# Patient Record
Sex: Male | Born: 1960
Health system: Southern US, Community
[De-identification: ages and names within clinical notes are randomized; demographics above are authoritative.]

## PROBLEM LIST (undated history)

## (undated) DIAGNOSIS — M549 Dorsalgia, unspecified: Secondary | ICD-10-CM

## (undated) DIAGNOSIS — J40 Bronchitis, not specified as acute or chronic: Secondary | ICD-10-CM

## (undated) DIAGNOSIS — G8929 Other chronic pain: Secondary | ICD-10-CM

## (undated) HISTORY — PX: HIP SURGERY: SHX245

---

## 2011-01-12 ENCOUNTER — Emergency Department (HOSPITAL_COMMUNITY)
Admission: EM | Admit: 2011-01-12 | Discharge: 2011-01-12 | Disposition: A | Discharge status: Home / Self Care | Payer: Self-pay | Attending: Emergency Medicine | Admitting: Emergency Medicine

## 2011-01-12 ENCOUNTER — Emergency Department (HOSPITAL_COMMUNITY): Payer: Self-pay

## 2011-01-12 DIAGNOSIS — F17200 Nicotine dependence, unspecified, uncomplicated: Secondary | ICD-10-CM | POA: Insufficient documentation

## 2011-01-12 DIAGNOSIS — M545 Low back pain, unspecified: Secondary | ICD-10-CM | POA: Insufficient documentation

## 2011-01-12 DIAGNOSIS — Z96649 Presence of unspecified artificial hip joint: Secondary | ICD-10-CM | POA: Insufficient documentation

## 2011-03-08 ENCOUNTER — Emergency Department (HOSPITAL_COMMUNITY)
Admission: EM | Admit: 2011-03-08 | Discharge: 2011-03-08 | Disposition: A | Discharge status: Home / Self Care | Payer: Self-pay | Attending: Emergency Medicine | Admitting: Emergency Medicine

## 2011-03-08 ENCOUNTER — Encounter: Payer: Self-pay | Admitting: *Deleted

## 2011-03-08 DIAGNOSIS — F17200 Nicotine dependence, unspecified, uncomplicated: Secondary | ICD-10-CM | POA: Insufficient documentation

## 2011-03-08 DIAGNOSIS — M5416 Radiculopathy, lumbar region: Secondary | ICD-10-CM

## 2011-03-08 DIAGNOSIS — S335XXA Sprain of ligaments of lumbar spine, initial encounter: Secondary | ICD-10-CM | POA: Insufficient documentation

## 2011-03-08 DIAGNOSIS — X500XXA Overexertion from strenuous movement or load, initial encounter: Secondary | ICD-10-CM | POA: Insufficient documentation

## 2011-03-08 DIAGNOSIS — IMO0002 Reserved for concepts with insufficient information to code with codable children: Secondary | ICD-10-CM | POA: Insufficient documentation

## 2011-03-08 HISTORY — DX: Bronchitis, not specified as acute or chronic: J40

## 2011-03-08 MED ORDER — PREDNISONE 20 MG PO TABS
60.0000 mg | ORAL_TABLET | Freq: Once | ORAL | Status: AC
  Administered 2011-03-08: 60 mg via ORAL

## 2011-03-08 MED ORDER — CYCLOBENZAPRINE HCL 10 MG PO TABS: 5.0000 mg | ORAL_TABLET | Freq: Once | ORAL | Status: DC

## 2011-03-08 MED ORDER — OXYCODONE-ACETAMINOPHEN 5-325 MG PO TABS
1.0000 | ORAL_TABLET | Freq: Once | ORAL | Status: AC
  Administered 2011-03-08: 1 via ORAL

## 2011-03-08 MED ORDER — CYCLOBENZAPRINE HCL 10 MG PO TABS: 10.0000 mg | ORAL_TABLET | Freq: Three times a day (TID) | ORAL | Status: AC | PRN

## 2011-03-08 MED ORDER — PREDNISONE 20 MG PO TABS: 60.0000 mg | ORAL_TABLET | Freq: Every day | ORAL | Status: AC

## 2011-03-08 MED ORDER — CYCLOBENZAPRINE HCL 10 MG PO TABS
10.0000 mg | ORAL_TABLET | Freq: Once | ORAL | Status: AC
  Administered 2011-03-08: 10 mg via ORAL

## 2011-03-08 MED ORDER — OXYCODONE-ACETAMINOPHEN 5-325 MG PO TABS: 1.0000 | ORAL_TABLET | ORAL | Status: AC | PRN

## 2011-03-08 NOTE — ED Notes (Signed)
EDPA at bedside to examine 

## 2011-03-08 NOTE — ED Provider Notes (Signed)
History     Chief Complaint  Patient presents with  . Back Pain  . Leg Pain   Patient is a 50 y.o. male presenting with back pain and leg pain. The history is provided by the patient.  Back Pain  This is a recurrent (Has a history of occasional episodes of low back pain,  worsened yesterday after lifting air conditioners at work.) problem. The current episode started yesterday. The problem has not changed since onset.The pain is associated with lifting heavy objects. The pain is present in the lumbar spine. The quality of the pain is described as stabbing. The pain radiates to the left thigh and right thigh. The pain is at a severity of 10/10. Associated symptoms include leg pain. Pertinent negatives include no chest pain, no fever, no numbness, no headaches, no abdominal pain, no dysuria and no weakness.  Leg Pain  Pertinent negatives include no numbness.    Past Medical History  Diagnosis Date  . Bronchitis     Past Surgical History  Procedure Date  . Hip pain     Family History  Problem Relation Age of Onset  . Diabetes Mother   . Hyperlipidemia Mother   . Hypertension Mother     History  Substance Use Topics  . Smoking status: Current Everyday Smoker  . Smokeless tobacco: Not on file  . Alcohol Use: Yes      Review of Systems  Constitutional: Negative for fever.  HENT: Negative for congestion, sore throat and neck pain.   Eyes: Negative.   Respiratory: Negative for chest tightness and shortness of breath.   Cardiovascular: Negative for chest pain.  Gastrointestinal: Negative for nausea and abdominal pain.  Genitourinary: Negative.  Negative for dysuria, urgency and decreased urine volume.  Musculoskeletal: Positive for back pain. Negative for myalgias, joint swelling and arthralgias.  Skin: Negative.  Negative for rash and wound.  Neurological: Negative for dizziness, weakness, light-headedness, numbness and headaches.  Hematological: Negative.     Psychiatric/Behavioral: Negative.     Physical Exam  BP 137/99  Pulse 90  Temp 97.4 F (36.3 C)  Resp 20  Ht 5\' 6"  (1.676 m)  Wt 145 lb (65.772 kg)  BMI 23.40 kg/m2  SpO2 98%  Physical Exam  Constitutional: He is oriented to person, place, and time. He appears well-developed and well-nourished. He appears distressed.  HENT:  Head: Normocephalic.  Eyes: Conjunctivae are normal.  Neck: Normal range of motion. Neck supple.  Cardiovascular: Normal rate and regular rhythm.   Pulmonary/Chest: Effort normal and breath sounds normal.  Abdominal: Soft. He exhibits no distension. There is no tenderness.  Musculoskeletal:       Lumbar back: He exhibits decreased range of motion and spasm. He exhibits no swelling, no edema and no deformity.       Paralumbar ttp.  Neurological: He is alert and oriented to person, place, and time. He has normal strength and normal reflexes. No sensory deficit.  Reflex Scores:      Patellar reflexes are 2+ on the right side and 2+ on the left side.      Achilles reflexes are 2+ on the right side and 2+ on the left side.      Slight antalgic gait secondary to pain.  Skin: Skin is warm and dry. No abrasion and no rash noted.    ED Course  Procedures  MDM No weakness or numbness,  No urinary or bowel changes to suggest neurologic emergency.      Medical  screening examination/treatment/procedure(s) were performed by non-physician practitioner and as supervising physician I was immediately available for consultation/collaboration.   Candis Musa, PA 03/08/11 1153  Shelda Jakes, MD 03/24/11 [phone]

## 2011-03-08 NOTE — ED Notes (Signed)
Medicated as ordered for pain.

## 2011-03-08 NOTE — ED Notes (Signed)
Pt c/o lower back pain radiating down both legs x 1 month that got re aggravated yesterday moving air conditioners.

## 2011-03-08 NOTE — ED Notes (Signed)
Awaiting EDPA assessment. 

## 2011-03-08 NOTE — ED Notes (Signed)
C/o lower back pain for approx. 1 month-states felt pop after heavy lifting and was seen and tx for same in this ED at that time; reports worsening of pain yesterday after lifting an AC unit; states pain radiates down both legs.

## 2011-03-08 NOTE — ED Notes (Signed)
Left in c/o spouse for transport home; a&ox4; in no distress 

## 2011-07-14 ENCOUNTER — Encounter (HOSPITAL_COMMUNITY): Payer: Self-pay | Admitting: *Deleted

## 2011-07-14 ENCOUNTER — Emergency Department (HOSPITAL_COMMUNITY)
Admission: EM | Admit: 2011-07-14 | Discharge: 2011-07-14 | Disposition: A | Discharge status: Home / Self Care | Payer: Self-pay | Attending: Emergency Medicine | Admitting: Emergency Medicine

## 2011-07-14 DIAGNOSIS — M549 Dorsalgia, unspecified: Secondary | ICD-10-CM

## 2011-07-14 DIAGNOSIS — F17200 Nicotine dependence, unspecified, uncomplicated: Secondary | ICD-10-CM | POA: Insufficient documentation

## 2011-07-14 DIAGNOSIS — M545 Low back pain, unspecified: Secondary | ICD-10-CM | POA: Insufficient documentation

## 2011-07-14 DIAGNOSIS — X500XXA Overexertion from strenuous movement or load, initial encounter: Secondary | ICD-10-CM | POA: Insufficient documentation

## 2011-07-14 HISTORY — DX: Other chronic pain: G89.29

## 2011-07-14 HISTORY — DX: Dorsalgia, unspecified: M54.9

## 2011-07-14 MED ORDER — HYDROCODONE-ACETAMINOPHEN 5-500 MG PO TABS: 1.0000 | ORAL_TABLET | Freq: Four times a day (QID) | ORAL | Status: AC | PRN

## 2011-07-14 NOTE — ED Notes (Signed)
Pt presents with chronic low back pain the radiates to bilateral legs intermittently. Pt states he has been lifiting TVs and is now unable to handle the pain. Pt states he is supposed to be going to a bone specialist but does not have the finances.

## 2011-07-14 NOTE — ED Provider Notes (Signed)
History     CSN: [id number] Arrival date & time: 07/14/2011 11:51 AM   First MD Initiated Contact with Patient 07/14/11 1307      Chief Complaint  Patient presents with  . Back Pain    (Consider location/radiation/quality/duration/timing/severity/associated sxs/prior treatment) HPI Comments: Dylan Dalton is a 50 y.o. male who presents to the Emergency Department complaining of back pain for 2 weeks after lifting and carrying television up stairs. He has a history of back pain and is in the process of getting financial aid to see a specialist. He has not taken any medicines for the pain. He denies numbness, tingling or extremity weakness. He has no PCP.  Patient is a 50 y.o. male presenting with back pain.  Back Pain     Past Medical History  Diagnosis Date  . Bronchitis   . Chronic back pain     Past Surgical History  Procedure Date  . Hip surgery     Family History  Problem Relation Age of Onset  . Diabetes Mother   . Hyperlipidemia Mother   . Hypertension Mother     History  Substance Use Topics  . Smoking status: Current Everyday Smoker  . Smokeless tobacco: Not on file  . Alcohol Use: Yes      Review of Systems  Musculoskeletal: Positive for back pain.  10 Systems reviewed and are negative for acute change except as noted in the HPI.  Allergies  Aspirin and Codeine  Home Medications  No current outpatient prescriptions on file.  BP 164/101  Pulse 68  Temp(Src) 98.5 F (36.9 C) (Oral)  SpO2 99%  Physical Exam  Nursing note and vitals reviewed. Constitutional: He is oriented to person, place, and time. He appears well-developed and well-nourished. No distress.  HENT:  Head: Normocephalic.  Mouth/Throat: Oropharynx is clear and moist.       Essentially edentulous  Eyes: EOM are normal.  Neck: Normal range of motion.  Cardiovascular: Normal rate, normal heart sounds and intact distal pulses.   Pulmonary/Chest: Effort normal.  Abdominal: Soft.   Musculoskeletal:       No  Spinal pain to percussion. Tenderness to bilateral lumbar spinal muscles with palpations. Able to bend over to touch toes without pain. Lateral bending causes mild pain.  Neurological: He is alert and oriented to person, place, and time. He has normal reflexes.  Skin: Skin is warm and dry.    ED Course  Procedures (including critical care time)  Labs Reviewed - No data to display No results found.   No diagnosis found.    MDM  Patient with chronic back pain who has exacerbated his pain with heavy lifting. Will give him a new referral for orthopedics.Pt stable in ED with no significant deterioration in condition.The patient appears reasonably screened and/or stabilized for discharge and I doubt any other medical condition or other [facility] requiring further screening, evaluation, or treatment in the ED at this time prior to discharge.  MDM Reviewed: previous chart, nursing note and vitals Reviewed previous: x-ray        Nicoletta Dress. Colon Branch, MD 07/14/11 1335

## 2011-07-14 NOTE — ED Notes (Signed)
Pt c/o lower back pain x 6 months; pt states he was here and told to go to a bone specialist but they do not have the money and have come back here

## 2011-07-14 NOTE — ED Notes (Signed)
Pt a/ox4. Resp even and unlabored. NAD at this time. D/C instructions reviewed with pt. Pt verbalized understanding. Pt ambulated to lobby with steady gate.  

## 2011-11-07 ENCOUNTER — Emergency Department (HOSPITAL_COMMUNITY)
Admission: EM | Admit: 2011-11-07 | Discharge: 2011-11-07 | Disposition: A | Discharge status: Home / Self Care | Payer: Self-pay | Attending: Emergency Medicine | Admitting: Emergency Medicine

## 2011-11-07 ENCOUNTER — Encounter (HOSPITAL_COMMUNITY): Payer: Self-pay | Admitting: Emergency Medicine

## 2011-11-07 DIAGNOSIS — R05 Cough: Secondary | ICD-10-CM | POA: Insufficient documentation

## 2011-11-07 DIAGNOSIS — R059 Cough, unspecified: Secondary | ICD-10-CM | POA: Insufficient documentation

## 2011-11-07 DIAGNOSIS — IMO0002 Reserved for concepts with insufficient information to code with codable children: Secondary | ICD-10-CM | POA: Insufficient documentation

## 2011-11-07 DIAGNOSIS — G8929 Other chronic pain: Secondary | ICD-10-CM | POA: Insufficient documentation

## 2011-11-07 DIAGNOSIS — M549 Dorsalgia, unspecified: Secondary | ICD-10-CM | POA: Insufficient documentation

## 2011-11-07 DIAGNOSIS — S46912A Strain of unspecified muscle, fascia and tendon at shoulder and upper arm level, left arm, initial encounter: Secondary | ICD-10-CM

## 2011-11-07 DIAGNOSIS — X500XXA Overexertion from strenuous movement or load, initial encounter: Secondary | ICD-10-CM | POA: Insufficient documentation

## 2011-11-07 DIAGNOSIS — F17200 Nicotine dependence, unspecified, uncomplicated: Secondary | ICD-10-CM | POA: Insufficient documentation

## 2011-11-07 MED ORDER — HYDROCODONE-ACETAMINOPHEN 5-325 MG PO TABS
2.0000 | ORAL_TABLET | Freq: Once | ORAL | Status: AC
  Administered 2011-11-07: 2 via ORAL
  Filled 2011-11-07: qty 2

## 2011-11-07 MED ORDER — HYDROCODONE-ACETAMINOPHEN 5-325 MG PO TABS: 1.0000 | ORAL_TABLET | ORAL | Status: AC | PRN

## 2011-11-07 MED ORDER — PROMETHAZINE HCL 12.5 MG PO TABS
12.5000 mg | ORAL_TABLET | Freq: Once | ORAL | Status: AC
  Administered 2011-11-07: 12.5 mg via ORAL
  Filled 2011-11-07: qty 1

## 2011-11-07 NOTE — ED Notes (Signed)
Pt c/o left shoulder pain since moving furniture yesterday. Denies cp/sob.

## 2011-11-07 NOTE — ED Notes (Signed)
Pt presents with left shoulder pain secondary to an injury after moving furniture. Pt states has no strength in hand and arm after shoulder injury. Pulses present and strong.

## 2011-11-07 NOTE — Discharge Instructions (Signed)
Shoulder Sprain PLEASE USE THE SHOULDER SLING UNTIL SEEN BY ORTHOPEDICS. NORCO MAY CAUSE DROWSINESS, USE WITH CAUTION.     A shoulder sprain is the result of damage to the tough, fiber-like tissues (ligaments) that help hold your shoulder in place. The ligaments may be stretched or torn. Besides the main shoulder joint (the ball and socket), there are several smaller joints that connect the bones in this area. A sprain usually involves one of those joints. Most often it is the acromioclavicular (or AC) joint. That is the joint that connects the collarbone (clavicle) and the shoulder blade (scapula) at the top point of the shoulder blade (acromion). A shoulder sprain is a mild form of what is called a shoulder separation. Recovering from a shoulder sprain may take some time. For some, pain lingers for several months. Most people recover without long term problems. CAUSES   A shoulder sprain is usually caused by some kind of trauma. This might be:   Falling on an outstretched arm.   Being hit hard on the shoulder.   Twisting the arm.   Shoulder sprains are more likely to occur in people who:   Play sports.   Have balance or coordination problems.  SYMPTOMS   Pain when you move your shoulder.   Limited ability to move the shoulder.   Swelling and tenderness on top of the shoulder.   Redness or warmth in the shoulder.   Bruising.   A change in the shape of the shoulder.  DIAGNOSIS  Your healthcare provider may:  Ask about your symptoms.   Ask about recent activity that might have caused those symptoms.   Examine your shoulder. You may be asked to do simple exercises to test movement. The other shoulder will be examined for comparison.   Order some tests that provide a look inside the body. They can show the extent of the injury. The tests could include:   X-rays.   CT (computed tomography) scan.   MRI (magnetic resonance imaging) scan.  RISKS AND COMPLICATIONS  Loss of  full shoulder motion.   Ongoing shoulder pain.  TREATMENT  How long it takes to recover from a shoulder sprain depends on how severe it was. Treatment options may include:  Rest. You should not use the arm or shoulder until it heals.   Ice. For 2 or 3 days after the injury, put an ice pack on the shoulder up to 4 times a day. It should stay on for 15 to 20 minutes each time. Wrap the ice in a towel so it does not touch your skin.   Over-the-counter medicine to relieve pain.   A sling or brace. This will keep the arm still while the shoulder is healing.   Physical therapy or rehabilitation exercises. These will help you regain strength and motion. Ask your healthcare provider when it is OK to begin these exercises.   Surgery. The need for surgery is rare with a sprained shoulder, but some people may need surgery to keep the joint in place and reduce pain.  HOME CARE INSTRUCTIONS   Ask your healthcare provider about what you should and should not do while your shoulder heals.   Make sure you know how to apply ice to the correct area of your shoulder.   Talk with your healthcare provider about which medications should be used for pain and swelling.   If rehabilitation therapy will be needed, ask your healthcare provider to refer you to a therapist. If it  is not recommended, then ask about at-home exercises. Find out when exercise should begin.  SEEK MEDICAL CARE IF:  Your pain, swelling, or redness at the joint increases. SEEK IMMEDIATE MEDICAL CARE IF:   You have a fever.   You cannot move your arm or shoulder.  Document Released: 12/10/2008 Document Revised: 07/13/2011 Document Reviewed: 12/10/2008 [facility] Patient Information 2012 [place], [place].

## 2011-11-07 NOTE — ED Provider Notes (Signed)
History     CSN: [id number]  Arrival date & time 11/07/11  1436   First MD Initiated Contact with Patient 11/07/11 1608      Chief Complaint  Patient presents with  . Shoulder Pain    (Consider location/radiation/quality/duration/timing/severity/associated sxs/prior treatment) Patient is a 51 y.o. male presenting with shoulder pain. The history is provided by the patient.  Shoulder Pain This is a new problem. The current episode started yesterday. The problem occurs constantly. The problem has been gradually worsening. Associated symptoms include coughing. Pertinent negatives include no abdominal pain, arthralgias, chest pain or neck pain. Associated symptoms comments: Weakness and pain moving the left shoulder.. The symptoms are aggravated by nothing. He has tried nothing for the symptoms. The treatment provided no relief.    Past Medical History  Diagnosis Date  . Bronchitis   . Chronic back pain     Past Surgical History  Procedure Date  . Hip surgery     Family History  Problem Relation Age of Onset  . Diabetes Mother   . Hyperlipidemia Mother   . Hypertension Mother     History  Substance Use Topics  . Smoking status: Current Everyday Smoker  . Smokeless tobacco: Not on file  . Alcohol Use: Yes      Review of Systems  Constitutional: Negative for activity change.       All ROS Neg except as noted in HPI  HENT: Negative for nosebleeds and neck pain.   Eyes: Negative for photophobia and discharge.  Respiratory: Positive for cough. Negative for shortness of breath and wheezing.   Cardiovascular: Negative for chest pain and palpitations.  Gastrointestinal: Negative for abdominal pain and blood in stool.  Genitourinary: Negative for dysuria, frequency and hematuria.  Musculoskeletal: Positive for back pain. Negative for arthralgias.  Skin: Negative.   Neurological: Negative for dizziness, seizures and speech difficulty.  Psychiatric/Behavioral: Negative for  hallucinations and confusion.    Allergies  Aspirin and Codeine  Home Medications   Current Outpatient Rx  Name Route Sig Dispense Refill  . ALBUTEROL SULFATE HFA 108 (90 BASE) MCG/ACT IN AERS Inhalation Inhale 2 puffs into the lungs every 6 (six) hours as needed. Bronchitis    . HYDROCODONE-ACETAMINOPHEN 5-325 MG PO TABS Oral Take 1 tablet by mouth every 4 (four) hours as needed for pain. 20 tablet 0    BP 159/99  Pulse 94  Temp 98.8 F (37.1 C)  Resp 18  Ht 5\' 6"  (1.676 m)  Wt 145 lb (65.772 kg)  BMI 23.40 kg/m2  SpO2 98%  Physical Exam  Nursing note and vitals reviewed. Constitutional: He is oriented to person, place, and time. He appears well-developed and well-nourished.  Non-toxic appearance.  HENT:  Head: Normocephalic.  Right Ear: Tympanic membrane and external ear normal.  Left Ear: Tympanic membrane and external ear normal.  Eyes: EOM and lids are normal. Pupils are equal, round, and reactive to light.  Neck: Normal range of motion. Neck supple. Carotid bruit is not present.  Cardiovascular: Normal rate, regular rhythm, normal heart sounds, intact distal pulses and normal pulses.   Pulmonary/Chest: Breath sounds normal. No respiratory distress.  Abdominal: Soft. Bowel sounds are normal. There is no tenderness. There is no guarding.  Musculoskeletal: Normal range of motion.       Pt has pain with abduction and adduction of the left shoulder. He has difficulty raising the left shoulder. No dislocation.  Lymphadenopathy:       Head (right side): No submandibular  adenopathy present.       Head (left side): No submandibular adenopathy present.    He has no cervical adenopathy.  Neurological: He is alert and oriented to person, place, and time. He has normal strength. No cranial nerve deficit or sensory deficit.  Skin: Skin is warm and dry.  Psychiatric: He has a normal mood and affect. His speech is normal.    ED Course  Procedures (including critical care  time)  Labs Reviewed - No data to display No results found.   1. Left shoulder strain       MDM  I have reviewed nursing notes, vital signs, and all appropriate lab and imaging results for this patient. Pt injured the left shoulder lifting and moving furniture 4/1. Question strain vs partial muscle tear. Pt to use the sling until seen by orthopedics. Rx for norco given to the patient.       Kathie Dike, [place] 11/07/11 [phone]

## 2011-11-12 NOTE — ED Provider Notes (Signed)
Evaluation and management procedures were performed by the PA/NP/Resident Physician under my supervision/collaboration.   Jakyri Brunkhorst D Montee Tallman, MD 11/12/11 1603 

## 2012-02-06 ENCOUNTER — Emergency Department (HOSPITAL_COMMUNITY)
Admission: EM | Admit: 2012-02-06 | Discharge: 2012-02-06 | Disposition: A | Discharge status: Home / Self Care | Payer: Self-pay | Attending: Emergency Medicine | Admitting: Emergency Medicine

## 2012-02-06 ENCOUNTER — Encounter (HOSPITAL_COMMUNITY): Payer: Self-pay | Admitting: *Deleted

## 2012-02-06 ENCOUNTER — Emergency Department (HOSPITAL_COMMUNITY): Payer: Self-pay

## 2012-02-06 DIAGNOSIS — M25559 Pain in unspecified hip: Secondary | ICD-10-CM | POA: Insufficient documentation

## 2012-02-06 DIAGNOSIS — S7010XA Contusion of unspecified thigh, initial encounter: Secondary | ICD-10-CM | POA: Insufficient documentation

## 2012-02-06 DIAGNOSIS — S7000XA Contusion of unspecified hip, initial encounter: Secondary | ICD-10-CM | POA: Insufficient documentation

## 2012-02-06 DIAGNOSIS — W010XXA Fall on same level from slipping, tripping and stumbling without subsequent striking against object, initial encounter: Secondary | ICD-10-CM | POA: Insufficient documentation

## 2012-02-06 DIAGNOSIS — Z87828 Personal history of other (healed) physical injury and trauma: Secondary | ICD-10-CM | POA: Insufficient documentation

## 2012-02-06 DIAGNOSIS — F17200 Nicotine dependence, unspecified, uncomplicated: Secondary | ICD-10-CM | POA: Insufficient documentation

## 2012-02-06 MED ORDER — HYDROCODONE-ACETAMINOPHEN 5-325 MG PO TABS: 1.0000 | ORAL_TABLET | ORAL | Status: AC | PRN

## 2012-02-06 MED ORDER — HYDROCODONE-ACETAMINOPHEN 5-325 MG PO TABS: 1.0000 | ORAL_TABLET | Freq: Once | ORAL | Status: DC

## 2012-02-06 MED ORDER — HYDROCODONE-ACETAMINOPHEN 5-325 MG PO TABS
1.0000 | ORAL_TABLET | Freq: Once | ORAL | Status: AC
  Administered 2012-02-06: 1 via ORAL
  Filled 2012-02-06: qty 1
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
## 2012-02-06 NOTE — ED Notes (Signed)
Pt reports has crutches at home that he can use.
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
## 2012-02-06 NOTE — Discharge Instructions (Signed)
Your x-rays are normal today for any new injury.  Please use the medication prescribed for pain and use your crutches to help minimize weightbearing.  Warm compresses or heating pad 20 minutes 3-4 times daily might help soothe your injury.  Call Dr. Romeo Apple for recheck of your injury if not improving over the next 4-5 days.

## 2012-02-07 NOTE — ED Provider Notes (Signed)
Medical screening examination/treatment/procedure(s) were performed by non-physician practitioner and as supervising physician I was immediately available for consultation/collaboration.   Eren Ryser, MD 02/07/12 0706 

## 2012-02-26 ENCOUNTER — Encounter (HOSPITAL_COMMUNITY): Payer: Self-pay | Admitting: Emergency Medicine

## 2012-02-26 ENCOUNTER — Emergency Department (HOSPITAL_COMMUNITY)
Admission: EM | Admit: 2012-02-26 | Discharge: 2012-02-26 | Disposition: A | Discharge status: Home / Self Care | Payer: Self-pay | Attending: Emergency Medicine | Admitting: Emergency Medicine

## 2012-02-26 DIAGNOSIS — M25552 Pain in left hip: Secondary | ICD-10-CM

## 2012-02-26 DIAGNOSIS — M5430 Sciatica, unspecified side: Secondary | ICD-10-CM | POA: Insufficient documentation

## 2012-02-26 DIAGNOSIS — F17200 Nicotine dependence, unspecified, uncomplicated: Secondary | ICD-10-CM | POA: Insufficient documentation

## 2012-02-26 DIAGNOSIS — M5432 Sciatica, left side: Secondary | ICD-10-CM

## 2012-02-26 DIAGNOSIS — M25559 Pain in unspecified hip: Secondary | ICD-10-CM | POA: Insufficient documentation

## 2012-02-26 DIAGNOSIS — G8929 Other chronic pain: Secondary | ICD-10-CM | POA: Insufficient documentation

## 2012-02-26 MED ORDER — OXYCODONE-ACETAMINOPHEN 5-325 MG PO TABS
1.0000 | ORAL_TABLET | Freq: Once | ORAL | Status: AC
  Administered 2012-02-26: 1 via ORAL
  Filled 2012-02-26: qty 1

## 2012-02-26 MED ORDER — OXYCODONE-ACETAMINOPHEN 5-325 MG PO TABS: ORAL_TABLET | ORAL | Status: DC

## 2012-02-26 NOTE — ED Provider Notes (Addendum)
History     CSN: [id number]  Arrival date & time 02/26/12  9562   First MD Initiated Contact with Patient 02/26/12 [phone]      Chief Complaint  Patient presents with  . Hip Pain    (Consider location/radiation/quality/duration/timing/severity/associated sxs/prior treatment) HPI  Past Medical History  Diagnosis Date  . Bronchitis   . Chronic back pain     Past Surgical History  Procedure Date  . Hip surgery     Family History  Problem Relation Age of Onset  . Diabetes Mother   . Hyperlipidemia Mother   . Hypertension Mother     History  Substance Use Topics  . Smoking status: Current Everyday Smoker -- 0.5 packs/day  . Smokeless tobacco: Not on file  . Alcohol Use: Yes     occ.      Review of Systems  Allergies  Aspirin and Codeine  Home Medications   Current Outpatient Rx  Name Route Sig Dispense Refill  . ALBUTEROL SULFATE HFA 108 (90 BASE) MCG/ACT IN AERS Inhalation Inhale 2 puffs into the lungs every 6 (six) hours as needed. For shortness of breath    . IBUPROFEN 200 MG PO TABS Oral Take 400 mg by mouth every 6 (six) hours as needed. For pain    . OXYCODONE-ACETAMINOPHEN 5-325 MG PO TABS  One tab po q 4-6 hrs prn pain 20 tablet 0    BP 143/98  Pulse 70  Temp 98.2 F (36.8 C) (Oral)  Resp 18  Ht 5\' 6"  (1.676 m)  Wt 145 lb (65.772 kg)  BMI 23.40 kg/m2  SpO2 98%  Physical Exam  ED Course  Procedures (including critical care time)  Labs Reviewed - No data to display No results found.   1. Left hip pain   2. Left sided sciatica       MDM  Medical screening examination/treatment/procedure(s) were conducted as a shared visit with non-physician practitioner(s) and myself.  I personally evaluated the patient during the encounter..  Chronic left lateral hip pain. Recent x-ray negative. Discussed with orthopedic surgeon. Pain management.   Seen by me. Chronic left hip pain. Recent x-ray negative. Referral to orthopedics. Discussed with  orthopedic surgeon     Donnetta Hutching, MD 02/26/12 1228  Donnetta Hutching, MD 02/26/12 1229  Donnetta Hutching, MD 02/27/12 [phone]

## 2012-02-26 NOTE — ED Notes (Signed)
Pt states has titanum in left hip area. Fell beginning of month and has been hurting since. Pt seen 02-06-2012 for same. Pt states he knows something is wrong even though xrays don't show anything. Pt uncomfortable with sitting.

## 2012-02-26 NOTE — ED Provider Notes (Signed)
History     CSN: [id number]  Arrival date & time 02/26/12  4098   First MD Initiated Contact with Patient 02/26/12 [phone]      Chief Complaint  Patient presents with  . Hip Pain    (Consider location/radiation/quality/duration/timing/severity/associated sxs/prior treatment) HPI Comments: Was referred to dr. Romeo Apple after ED visit on 02-06-12.  Wife states she was told that if they paid $125 he would be given a note to see somebody else.  i spoke with dr. Romeo Apple today and he does know what that meant but that the pt did need to pay something and that he would see him.   Patient is a 51 y.o. male presenting with hip pain. The history is provided by the patient. No language interpreter was used.  Hip Pain This is a new problem. Episode onset: fell on 02-06-12. The problem occurs constantly. The problem has been unchanged. Pertinent negatives include no chills or fever. The symptoms are aggravated by walking and standing. He has tried nothing for the symptoms.    Past Medical History  Diagnosis Date  . Bronchitis   . Chronic back pain     Past Surgical History  Procedure Date  . Hip surgery     Family History  Problem Relation Age of Onset  . Diabetes Mother   . Hyperlipidemia Mother   . Hypertension Mother     History  Substance Use Topics  . Smoking status: Current Everyday Smoker -- 0.5 packs/day  . Smokeless tobacco: Not on file  . Alcohol Use: Yes     occ.      Review of Systems  Constitutional: Negative for fever and chills.  Musculoskeletal:       Hip pain   All other systems reviewed and are negative.    Allergies  Aspirin and Codeine  Home Medications   Current Outpatient Rx  Name Route Sig Dispense Refill  . ALBUTEROL SULFATE HFA 108 (90 BASE) MCG/ACT IN AERS Inhalation Inhale 2 puffs into the lungs every 6 (six) hours as needed. For shortness of breath    . IBUPROFEN 200 MG PO TABS Oral Take 400 mg by mouth every 6 (six) hours as needed. For  pain    . OXYCODONE-ACETAMINOPHEN 5-325 MG PO TABS  One tab po q 4-6 hrs prn pain 20 tablet 0    BP 143/98  Pulse 70  Temp 98.2 F (36.8 C) (Oral)  Resp 18  Ht 5\' 6"  (1.676 m)  Wt 145 lb (65.772 kg)  BMI 23.40 kg/m2  SpO2 98%  Physical Exam  Nursing note and vitals reviewed. Constitutional: He is oriented to person, place, and time. He appears well-developed and well-nourished.  HENT:  Head: Normocephalic and atraumatic.  Eyes: EOM are normal.  Neck: Normal range of motion.  Cardiovascular: Normal rate, regular rhythm, normal heart sounds and intact distal pulses.   Pulmonary/Chest: Effort normal and breath sounds normal. No respiratory distress.  Abdominal: Soft. He exhibits no distension. There is no tenderness.  Musculoskeletal: He exhibits tenderness.       Left hip: He exhibits decreased range of motion and bony tenderness. He exhibits no swelling, no crepitus, no deformity and no laceration.       Legs: Neurological: He is alert and oriented to person, place, and time.  Skin: Skin is warm and dry.  Psychiatric: He has a normal mood and affect. Judgment normal.    ED Course  Procedures (including critical care time)  Labs Reviewed - No data  to display No results found. Reviewed x-rays done on 02-06-12.  Another x-ray not ordered.  i have also spoken with dr. Romeo Apple.  Will see pt.  1. Left hip pain   2. Left sided sciatica       MDM  rx-percocet, 20         Evalina Field, [place] 02/26/12 1035  Evalina Field, [place] 02/27/12 [phone]

## 2012-03-01 NOTE — ED Provider Notes (Signed)
Medical screening examination/treatment/procedure(s) were performed by non-physician practitioner and as supervising physician I was immediately available for consultation/collaboration.  Left hip pain.  Recent x-ray negative for fracture. Will refer to orthopedics surgeon  Donnetta Hutching, MD 03/01/12 [phone]

## 2012-03-03 ENCOUNTER — Emergency Department (HOSPITAL_COMMUNITY)
Admission: EM | Admit: 2012-03-03 | Discharge: 2012-03-03 | Disposition: A | Discharge status: Home / Self Care | Payer: Self-pay | Attending: Emergency Medicine | Admitting: Emergency Medicine

## 2012-03-03 ENCOUNTER — Encounter (HOSPITAL_COMMUNITY): Payer: Self-pay | Admitting: *Deleted

## 2012-03-03 DIAGNOSIS — G8929 Other chronic pain: Secondary | ICD-10-CM | POA: Insufficient documentation

## 2012-03-03 DIAGNOSIS — M25559 Pain in unspecified hip: Secondary | ICD-10-CM | POA: Insufficient documentation

## 2012-03-03 DIAGNOSIS — M545 Low back pain, unspecified: Secondary | ICD-10-CM | POA: Insufficient documentation

## 2012-03-03 DIAGNOSIS — F17200 Nicotine dependence, unspecified, uncomplicated: Secondary | ICD-10-CM | POA: Insufficient documentation

## 2012-03-03 DIAGNOSIS — M5416 Radiculopathy, lumbar region: Secondary | ICD-10-CM

## 2012-03-03 DIAGNOSIS — R1032 Left lower quadrant pain: Secondary | ICD-10-CM

## 2012-03-03 MED ORDER — OXYCODONE-ACETAMINOPHEN 5-325 MG PO TABS: 1.0000 | ORAL_TABLET | ORAL | Status: AC | PRN

## 2012-03-03 MED ORDER — OXYCODONE-ACETAMINOPHEN 5-325 MG PO TABS
2.0000 | ORAL_TABLET | Freq: Once | ORAL | Status: AC
  Administered 2012-03-03: 2 via ORAL
  Filled 2012-03-03: qty 2

## 2012-03-03 MED ORDER — CYCLOBENZAPRINE HCL 10 MG PO TABS: 10.0000 mg | ORAL_TABLET | Freq: Three times a day (TID) | ORAL | Status: AC | PRN

## 2012-03-03 NOTE — ED Provider Notes (Signed)
History     CSN: [id number]  Arrival date & time 03/03/12  4098   First MD Initiated Contact with Patient 03/03/12 [phone]      Chief Complaint  Patient presents with  . Hip Pain    (Consider location/radiation/quality/duration/timing/severity/associated sxs/prior treatment) HPI Comments: Patient with hx of chronic low back pain and hip pain c/o increasing pain to the left hip for several days to a week.  Sx's began earlier this month after a fall.  States he has been seen here for similar sx's.  Pain is worse with weight bearing and improves with rest.He denies new injury or change in his previous symptoms.  States he has an upcoming appt with a "specialist".  He denies fever, urinary sx's, incontinence or numbness of his left hip or leg.    Patient is a 51 y.o. male presenting with back pain. The history is provided by the patient and the spouse.  Back Pain  This is a chronic problem. The problem occurs constantly. The problem has been gradually worsening. The pain is associated with falling. The pain is present in the lumbar spine and sacro-iliac joint. The quality of the pain is described as aching. The pain radiates to the left thigh, left knee and left foot. The pain is moderate. The symptoms are aggravated by certain positions, twisting and bending. The pain is the same all the time. Associated symptoms include leg pain and tingling. Pertinent negatives include no chest pain, no fever, no numbness, no abdominal pain, no abdominal swelling, no bowel incontinence, no perianal numbness, no bladder incontinence, no dysuria, no pelvic pain, no paresthesias, no paresis and no weakness. Associated symptoms comments: Left hip pain. He has tried NSAIDs, ice, heat and bed rest for the symptoms. The treatment provided mild relief.    Past Medical History  Diagnosis Date  . Bronchitis   . Chronic back pain     Past Surgical History  Procedure Date  . Hip surgery     Family History  Problem  Relation Age of Onset  . Diabetes Mother   . Hyperlipidemia Mother   . Hypertension Mother     History  Substance Use Topics  . Smoking status: Current Everyday Smoker -- 0.5 packs/day  . Smokeless tobacco: Not on file  . Alcohol Use: Yes     occ.      Review of Systems  Constitutional: Negative for fever.  Respiratory: Negative for shortness of breath.   Cardiovascular: Negative for chest pain.  Gastrointestinal: Negative for vomiting, abdominal pain, constipation and bowel incontinence.  Genitourinary: Negative for bladder incontinence, dysuria, hematuria, flank pain, decreased urine volume, difficulty urinating and pelvic pain.       Perineal numbness or incontinence of urine or feces  Musculoskeletal: Positive for back pain. Negative for joint swelling.  Skin: Negative for rash.  Neurological: Positive for tingling. Negative for weakness, numbness and paresthesias.  All other systems reviewed and are negative.    Allergies  Aspirin and Codeine  Home Medications   Current Outpatient Rx  Name Route Sig Dispense Refill  . ALBUTEROL SULFATE HFA 108 (90 BASE) MCG/ACT IN AERS Inhalation Inhale 2 puffs into the lungs every 6 (six) hours as needed. For shortness of breath    . IBUPROFEN 200 MG PO TABS Oral Take 400 mg by mouth every 6 (six) hours as needed. For pain    . OXYCODONE-ACETAMINOPHEN 5-325 MG PO TABS  One tab po q 4-6 hrs prn pain 20 tablet 0  BP 130/89  Pulse 68  Temp 98.1 F (36.7 C)  Resp 18  SpO2 97%  Physical Exam  Nursing note and vitals reviewed. Constitutional: He is oriented to person, place, and time. He appears well-developed and well-nourished. No distress.  HENT:  Head: Normocephalic and atraumatic.  Neck: Normal range of motion. Neck supple.  Cardiovascular: Normal rate, regular rhythm and intact distal pulses.   No murmur heard. Pulmonary/Chest: Effort normal and breath sounds normal.  Musculoskeletal: He exhibits tenderness. He  exhibits no edema.       Left hip: He exhibits tenderness. He exhibits normal range of motion, no bony tenderness, no swelling, no crepitus, no deformity and no laceration.       Lumbar back: He exhibits tenderness, bony tenderness and pain. He exhibits normal range of motion, no swelling, no deformity, no laceration and normal pulse.       Back:       Legs: Neurological: He is alert and oriented to person, place, and time. No cranial nerve deficit or sensory deficit. He exhibits normal muscle tone. Coordination and gait normal.  Reflex Scores:      Patellar reflexes are 2+ on the right side and 2+ on the left side.      Achilles reflexes are 2+ on the right side and 2+ on the left side. Skin: Skin is warm and dry.    ED Course  Procedures (including critical care time)  Labs Reviewed - No data to display      MDM    Patient has ttp of the lumbar spine and left paraspinal muscles.  No focal neuro deficits on exam.  Ambulates with a slow but steady gait.  Pt also has ttp of the left groin.  No edema, erythema, scrotal tenderness or masses.  Likely hip pain related to lumbar radiculopathy.  i doubt infectious process or cauda equina syndrome.   I will arrange for pt to have outpatient MRI of L spine and pelvis.  appt made for Wednesday 03/06/12 at 10:00 am.  Pt agrees to orthopedic f/u.  Patient requesting possible assistance.  Advised to contact Child psychotherapist for possible financial assistance.     The patient appears reasonably screened and/or stabilized for discharge and I doubt any other medical condition or other [facility] requiring further screening, evaluation, or treatment in the ED at this time prior to discharge.   Prescribed:  Flexeril percocet #24      Kenedy Haisley L. [place], [place] 03/08/12 1615

## 2012-03-03 NOTE — ED Notes (Signed)
Pt c/o fall on 02/06/2012, has been seen in er twice for same, continues to have left hip pain that radiates down left groin left leg area.

## 2012-03-03 NOTE — ED Notes (Signed)
Pt discharged. Pt stable at time of discharge. Medications reviewed pt has no questions regarding discharge at this time. Pt voiced understanding of discharge instructions. Pt informed of mri apt on Wednesday 03/06/12.

## 2012-03-05 MED FILL — Oxycodone w/ Acetaminophen Tab 5-325 MG: ORAL | Qty: 6 | Status: AC

## 2012-03-06 ENCOUNTER — Ambulatory Visit (HOSPITAL_COMMUNITY)
Admit: 2012-03-06 | Discharge: 2012-03-06 | Disposition: A | Discharge status: Home / Self Care | Payer: Self-pay | Attending: Emergency Medicine | Admitting: Emergency Medicine

## 2012-03-06 ENCOUNTER — Ambulatory Visit (HOSPITAL_COMMUNITY)
Admit: 2012-03-06 | Discharge: 2012-03-06 | Disposition: A | Discharge status: Home / Self Care | Payer: Self-pay | Source: Ambulatory Visit | Attending: Emergency Medicine | Admitting: Emergency Medicine

## 2012-03-06 ENCOUNTER — Ambulatory Visit (HOSPITAL_COMMUNITY)
Admission: RE | Admit: 2012-03-06 | Discharge: 2012-03-06 | Disposition: A | Discharge status: Home / Self Care | Payer: Self-pay | Source: Ambulatory Visit | Attending: Emergency Medicine | Admitting: Emergency Medicine

## 2012-03-06 ENCOUNTER — Other Ambulatory Visit (HOSPITAL_COMMUNITY): Payer: Self-pay | Admitting: Emergency Medicine

## 2012-03-06 DIAGNOSIS — M51379 Other intervertebral disc degeneration, lumbosacral region without mention of lumbar back pain or lower extremity pain: Secondary | ICD-10-CM | POA: Insufficient documentation

## 2012-03-06 DIAGNOSIS — M5126 Other intervertebral disc displacement, lumbar region: Secondary | ICD-10-CM | POA: Insufficient documentation

## 2012-03-06 DIAGNOSIS — M25559 Pain in unspecified hip: Secondary | ICD-10-CM | POA: Insufficient documentation

## 2012-03-06 DIAGNOSIS — Z01818 Encounter for other preprocedural examination: Secondary | ICD-10-CM

## 2012-03-06 DIAGNOSIS — M5137 Other intervertebral disc degeneration, lumbosacral region: Secondary | ICD-10-CM | POA: Insufficient documentation

## 2012-03-06 DIAGNOSIS — R1032 Left lower quadrant pain: Secondary | ICD-10-CM | POA: Insufficient documentation

## 2012-03-06 DIAGNOSIS — M545 Low back pain, unspecified: Secondary | ICD-10-CM | POA: Insufficient documentation

## 2012-03-13 NOTE — ED Provider Notes (Signed)
Medical screening examination/treatment/procedure(s) were conducted as a shared visit with non-physician practitioner(s) and myself.  I personally evaluated the patient during the encounter.  Left lower back pain with radiation to left foot. No bowel or bladder incontinence. Will get outpatient MRI of lumbar spine  Donnetta Hutching, MD 03/13/12 1512

## 2012-03-18 ENCOUNTER — Encounter: Payer: Self-pay | Admitting: Orthopedic Surgery

## 2012-03-18 ENCOUNTER — Ambulatory Visit (INDEPENDENT_AMBULATORY_CARE_PROVIDER_SITE_OTHER): Payer: Self-pay | Admitting: Orthopedic Surgery

## 2012-03-18 VITALS — BP 120/90 | Ht 66.0 in | Wt 145.0 lb

## 2012-03-18 DIAGNOSIS — M5136 Other intervertebral disc degeneration, lumbar region: Secondary | ICD-10-CM | POA: Insufficient documentation

## 2012-03-18 DIAGNOSIS — M169 Osteoarthritis of hip, unspecified: Secondary | ICD-10-CM | POA: Insufficient documentation

## 2012-03-18 DIAGNOSIS — M5126 Other intervertebral disc displacement, lumbar region: Secondary | ICD-10-CM

## 2012-03-18 DIAGNOSIS — M5137 Other intervertebral disc degeneration, lumbosacral region: Secondary | ICD-10-CM

## 2012-03-18 MED ORDER — HYDROCODONE-ACETAMINOPHEN 5-325 MG PO TABS: 1.0000 | ORAL_TABLET | ORAL | Status: AC | PRN

## 2012-03-18 NOTE — Patient Instructions (Addendum)
Referral to neurosurgeon for back and  Referral to joint specialist for hip at [facility] give at least 1 week notice for appointment   Total Hip Replacement In total hip replacement, the damaged hip is replaced with an artificial hip joint (prosthesis). The purpose of this surgery is to reduce pain and improve your range of motion. It is one of the most successful joint replacement surgeries. LET YOUR CAREGIVER KNOW ABOUT:    Allergies.   Medicines taken, including herbs, eyedrops, over-the-counter medicines, and creams.   Use of steroids (by mouth or creams).   Previous problems with anesthetics.   Family history of anesthetic problems.   Possibility of pregnancy, if this applies.   History of blood clots (thrombophlebitis).   History of bleeding or blood problems.   Previous surgery.   Other health problems.  BEFORE THE PROCEDURE    Do not eat or drink anything for as long as directed by your caregiver before surgery.   You should be present 60 minutes before your procedure or as directed by your caregiver.  PROCEDURE An intravenous (IV) line for giving fluids will be started. You will be given medicines and gas to make you sleep (anesthetic), or you will be given medicines through a needle in your back to make you numb from the waist down. Your surgeon will take out any damaged cartilage and bone. He or she will then put in new metal, plastic, or ceramic joint surfaces to restore alignment and function to your hip. AFTER THE PROCEDURE   After the procedure, you will be taken to the recovery area where a nurse will watch and check your progress. You may have a long, narrow tube (catheter) in your bladder after surgery. The catheter helps you empty your bladder (pass your urine). Once you are awake, stable, and taking fluids well, you will be returned to your room. You will receive physical therapy until you are doing well and your caregiver feels it is safe for you to go home.  If you do not have help at home, you may need to go to an extended care facility for 5 to 14 days after the procedure. Document Released: 10/30/2000 Document Revised: 07/13/2011 Document Reviewed: 05/26/2009 [facility] Patient Information 2012 [place], [place].Degenerative Disc Disease Degenerative disc disease is a condition caused by the changes that occur in the cushions of the backbone (spinal discs) as you grow older. Spinal discs are soft and compressible discs located between the bones of the spine (vertebrae). They act like shock absorbers. Degenerative disc disease can affect the wholespine. However, the neck and lower back are most commonly affected. Many changes can occur in the spinal discs with aging, such as:  The spinal discs may dry and shrink.   Small tears may occur in the tough, outer covering of the disc (annulus).   The disc space may become smaller due to loss of water.   Abnormal growths in the bone (spurs) may occur. This can put pressure on the nerve roots exiting the spinal canal, causing pain.   The spinal canal may become narrowed.  CAUSES   Degenerative disc disease is a condition caused by the changes that occur in the spinal discs with aging. The exact cause is not known, but there is a genetic basis for many patients. Degenerative changes can occur due to loss of fluid in the disc. This makes the disc thinner and reduces the space between the backbones. Small cracks can develop in the outer layer of  the disc. This can lead to the breakdown of the disc. You are more likely to get degenerative disc disease if you are overweight. Smoking cigarettes and doing heavy work such as weightlifting can also increase your risk of this condition. Degenerative changes can start after a sudden injury. Growth of bone spurs can compress the nerve roots and cause pain.   SYMPTOMS   The symptoms vary from person to person. Some people may have no pain, while others have severe pain. The  pain may be so severe that it can limit your activities. The location of the pain depends on the part of your backbone that is affected. You will have neck or arm pain if a disc in the neck area is affected. You will have pain in your back, buttocks, or legs if a disc in the lower back is affected. The pain becomes worse while bending, reaching up, or with twisting movements. The pain may start gradually and then get worse as time passes. It may also start after a major or minor injury. You may feel numbness or tingling in the arms or legs.   DIAGNOSIS   Your caregiver will ask you about your symptoms and about activities or habits that may cause the pain. He or she may also ask about any injuries, diseases, ortreatments you have had earlier. Your caregiver will examine you to check for the range of movement that is possible in the affected area, to check for strength in your extremities, and to check for sensation in the areas of the arms and legs supplied by different nerve roots. An X-ray of the spine may be taken. Your caregiver may suggest other imaging tests, such as a computerized magnetic scan (MRI), if needed.   TREATMENT   Treatment includes rest, modifying your activities, and applying ice and heat. Your caregiver may prescribe medicines to reduce your pain and may ask you to do some exercises to strengthen your back. In some cases, you may need surgery. You and your caregiver will decide on the treatment that is best for you. HOME CARE INSTRUCTIONS    Follow proper lifting and walking techniques as advised by your caregiver.   Maintain good posture.   Exercise regularly as advised.   Perform relaxation exercises.   Change your sitting, standing, and sleeping habits as advised. Change positions frequently.   Lose weight as advised.   Stop smoking if you smoke.   Wear supportive footwear.  SEEK MEDICAL CARE IF:   The pain does not go away within 1 to 4 weeks. SEEK IMMEDIATE MEDICAL  CARE IF:    The pain is severe.   You notice weakness in your arms, hands, or legs.   You begin to lose control of your bladder or bowel.  MAKE SURE YOU:    Understand these instructions.   Will watch your condition.   Will get help right away if you are not doing well or get worse.  Document Released: 05/21/2007 Document Revised: 07/13/2011 Document Reviewed: 05/21/2007 [facility] Patient Information 2012 [place], [place].Lumbar Discectomy Care After A discectomy involves removal of discmaterial (the cartilage-like structures located between the bones of the back). It is done to relieve pressure on nerve roots. It can be used as a treatment for a back problem. The time in surgery depends on the findings in surgery and what is necessary to correct the problems. HOME CARE INSTRUCTIONS    Check the cut (incision) made by the surgeon twice a day for signs of  infection. Some signs of infection may include:   A foul smelling, greenish or yellowish discharge from the wound.   Increased pain.   Increased redness over the incision (operative) site.   The skin edges may separate.   Flu-like symptoms (problems).   A temperature above 101.5 F (38.6 C).   Change your bandages in about 24 to 36 hours following surgery or as directed.   You may shower once the bandage is removed or as directed. Avoid bathtubs, swimming pools and hot tubs for three weeks or until your incision has healed completely. If you have stitches or staples, they may be removed 2 to 3 weeks after surgery, or as directed by your doctor. This may be done by your doctor or caregiver.   Follow your doctor's instructions as to safe activities, exercises, and physical therapy.   Weight reduction may be beneficial if you are overweight.   Daily exercise is helpful to prevent the return of problems. Walking is permitted. You may use a treadmill without an incline. Cut down on activities and exercise if you have discomfort.  You may also go up and down stairs as much as you can tolerate.   DO NOT lift anything heavier than 10 to 15 lbs. Avoid bending or twisting at the waist. Always bend your knees when lifting.   Maintain strength and range of motion as instructed.   Do not drive for 2 to 3 weeks, or as directed by your doctors. You may be a passenger for 20 to 30 minute trips. Lying back in the passenger seat may be more comfortable for you. Always wear a seatbelt.   Limit your sitting in a regular chair to 20 to 30 minutes at a time. There are no limitations for sitting in a recliner. You should lie down or walk in between sitting periods.   Only take over-the-counter or prescription medicines for pain, discomfort, or fever as directed by your caregiver.  SEEK MEDICAL CARE IF:    There is increased bleeding (more than a small spot) from the wound.   You notice redness, swelling, or increasing pain in the wound.   Pus is coming from wound.   You develop an unexplained oral temperature above 102 F (38.9 C) develops.   You notice a foul smell coming from the wound or dressing.   You have increasing pain in your wound.  SEEK IMMEDIATE MEDICAL CARE IF:    You develop a rash.   You have difficulty breathing.   You develop any allergic problems to medicines given.  Document Released: 06/28/2004 Document Revised: 07/13/2011 Document Reviewed: 10/17/2007 [facility] Patient Information 2012 [place], [place].
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
## 2012-03-19 ENCOUNTER — Other Ambulatory Visit: Payer: Self-pay | Admitting: *Deleted

## 2012-03-19 ENCOUNTER — Telehealth: Payer: Self-pay | Admitting: *Deleted

## 2012-03-19 DIAGNOSIS — IMO0002 Reserved for concepts with insufficient information to code with codable children: Secondary | ICD-10-CM

## 2012-03-19 DIAGNOSIS — M1612 Unilateral primary osteoarthritis, left hip: Secondary | ICD-10-CM

## 2012-03-19 NOTE — Telephone Encounter (Signed)
All notes sent to [facility] neurosurgery at fax # [phone] And [facility] orthopaedics at fax # [phone]

## 2012-03-21 ENCOUNTER — Telehealth: Payer: Self-pay | Admitting: *Deleted

## 2012-03-21 NOTE — Telephone Encounter (Signed)
Sorry that's all we can give

## 2012-03-21 NOTE — Telephone Encounter (Signed)
Called patient to advise, left message.  

## 2012-03-25 NOTE — Telephone Encounter (Signed)
Called patient, left message.

## 2012-05-02 ENCOUNTER — Telehealth: Payer: Self-pay | Admitting: Orthopedic Surgery

## 2012-05-02 NOTE — Telephone Encounter (Signed)
Patient is asking (called yesterday as well 05/01/12) to follow up on refill request from [facility] for Hydrocodone 5/325.  I followed up with pharmacy as nurse is not available this afternoon.  Per Kirt Boys at [facility], request was denied due to patient having been referred out (referral appointment scheduled 05/14/12 in [place].)  Routing to nurse to verify.  Patient's only current contact # is [phone] [facility]).

## 2012-05-03 NOTE — Telephone Encounter (Signed)
Patient and wife/companion (signed authorization on file to speak with her).  Made aware of the denial of the refill request per Dr. Romeo Apple, as noted.  Patient having difficulty finding transportation for the referral appointment in [facility].  Is there a specialist closer to [place] (possibly Dr. Channing Mutters, [facility])?  Wynelle Link CALL Ph # [phone], which is the only current working ph#.

## 2012-05-09 NOTE — Telephone Encounter (Signed)
Called patient, left message.

## 2012-05-17 NOTE — Telephone Encounter (Signed)
Called patient, not available, left message

## 2012-12-21 ENCOUNTER — Emergency Department (HOSPITAL_COMMUNITY)
Admission: EM | Admit: 2012-12-21 | Discharge: 2012-12-21 | Disposition: A | Discharge status: Home / Self Care | Payer: Self-pay | Attending: Emergency Medicine | Admitting: Emergency Medicine

## 2012-12-21 ENCOUNTER — Encounter (HOSPITAL_COMMUNITY): Payer: Self-pay

## 2012-12-21 ENCOUNTER — Emergency Department (HOSPITAL_COMMUNITY): Payer: Self-pay

## 2012-12-21 DIAGNOSIS — G8929 Other chronic pain: Secondary | ICD-10-CM | POA: Insufficient documentation

## 2012-12-21 DIAGNOSIS — Z9889 Other specified postprocedural states: Secondary | ICD-10-CM | POA: Insufficient documentation

## 2012-12-21 DIAGNOSIS — W11XXXA Fall on and from ladder, initial encounter: Secondary | ICD-10-CM | POA: Insufficient documentation

## 2012-12-21 DIAGNOSIS — S76012A Strain of muscle, fascia and tendon of left hip, initial encounter: Secondary | ICD-10-CM

## 2012-12-21 DIAGNOSIS — W19XXXA Unspecified fall, initial encounter: Secondary | ICD-10-CM

## 2012-12-21 DIAGNOSIS — F17200 Nicotine dependence, unspecified, uncomplicated: Secondary | ICD-10-CM | POA: Insufficient documentation

## 2012-12-21 DIAGNOSIS — Y9339 Activity, other involving climbing, rappelling and jumping off: Secondary | ICD-10-CM | POA: Insufficient documentation

## 2012-12-21 DIAGNOSIS — J45909 Unspecified asthma, uncomplicated: Secondary | ICD-10-CM | POA: Insufficient documentation

## 2012-12-21 DIAGNOSIS — IMO0002 Reserved for concepts with insufficient information to code with codable children: Secondary | ICD-10-CM | POA: Insufficient documentation

## 2012-12-21 DIAGNOSIS — S39012A Strain of muscle, fascia and tendon of lower back, initial encounter: Secondary | ICD-10-CM

## 2012-12-21 DIAGNOSIS — Y929 Unspecified place or not applicable: Secondary | ICD-10-CM | POA: Insufficient documentation

## 2012-12-21 DIAGNOSIS — S335XXA Sprain of ligaments of lumbar spine, initial encounter: Secondary | ICD-10-CM | POA: Insufficient documentation

## 2012-12-21 DIAGNOSIS — Z79899 Other long term (current) drug therapy: Secondary | ICD-10-CM | POA: Insufficient documentation

## 2012-12-21 MED ORDER — HYDROCODONE-ACETAMINOPHEN 5-325 MG PO TABS: 1.0000 | ORAL_TABLET | ORAL | Status: DC | PRN

## 2012-12-21 MED ORDER — HYDROCODONE-ACETAMINOPHEN 5-325 MG PO TABS
2.0000 | ORAL_TABLET | Freq: Once | ORAL | Status: AC
  Administered 2012-12-21: 2 via ORAL
  Filled 2012-12-21: qty 2

## 2012-12-21 NOTE — ED Notes (Signed)
Patient with no complaints at this time. Respirations even and unlabored. Skin warm/dry. Discharge instructions reviewed with patient at this time. Patient given opportunity to voice concerns/ask questions. Patient discharged at this time and left Emergency Department with steady gait.   

## 2012-12-21 NOTE — ED Provider Notes (Signed)
History  This chart was scribed for Joya Gaskins, MD by Shari Heritage, ED Scribe. The patient was seen in room APA12/APA12. Patient's care was started at 1014.   CSN: [id number]  Arrival date & time 12/21/12  1003   First MD Initiated Contact with Patient 12/21/12 1014      Chief Complaint  Patient presents with  . Fall    The history is provided by the patient. No language interpreter was used.    HPI Comments: Dylan Dalton is a 52 y.o. male with history of chronic back pain and asthma who presents to the Emergency Department complaining of a fall from a ladder than occurred yesterday. Patient states that he slipped from a height of 2 feet and landed on his back and left side. He denies head injury or loss of consciousness. He did not land on his feet. Patient states that he didn't have pain immediately after the fall, but he is now having moderate, constant lower back and left hip pain that is progressively worsening. He was ambulatory after the fall, but patient states that ambulation is difficult now secondary to pain. Patient denies neck pain, nausea, vomiting, extremity weakness or fever. Patient has a history of left hip surgery with hardware placement.    Past Medical History  Diagnosis Date  . Bronchitis   . Chronic back pain     Past Surgical History  Procedure Laterality Date  . Hip surgery      Family History  Problem Relation Age of Onset  . Diabetes Mother   . Hyperlipidemia Mother   . Hypertension Mother   . Arthritis    . Asthma      History  Substance Use Topics  . Smoking status: Current Every Day Smoker -- 0.50 packs/day  . Smokeless tobacco: Not on file  . Alcohol Use: Yes     Comment: occ.      Review of Systems  Constitutional: Negative for fever.  HENT: Negative for neck pain.   Gastrointestinal: Negative for nausea and vomiting.  Musculoskeletal: Positive for back pain.  Neurological: Negative for syncope and weakness.    Allergies   Aspirin; Codeine; and Oxycodone-acetaminophen  Home Medications   Current Outpatient Rx  Name  Route  Sig  Dispense  Refill  . albuterol (PROVENTIL HFA;VENTOLIN HFA) 108 (90 BASE) MCG/ACT inhaler   Inhalation   Inhale 2 puffs into the lungs every 6 (six) hours as needed. For shortness of breath           Triage Vitals: BP 120/85  Pulse 75  Temp(Src) 98.2 F (36.8 C) (Oral)  Resp 18  SpO2 97%  Physical Exam CONSTITUTIONAL: Well developed/well nourished HEAD: Normocephalic/atraumatic EYES: EOMI/PERRL ENMT: Mucous membranes moist NECK: supple no meningeal signs SPINE: no cervical tenderness, NEXUS criteria met, lower thoracic and lumbar tenderness noted, No bruising/crepitance/stepoffs noted to spine CV: S1/S2 noted, no murmurs/rubs/gallops noted LUNGS: Lungs are clear to auscultation bilaterally, no apparent distress ABDOMEN: soft, nontender, no rebound or guarding GU:no cva tenderness NEURO: Pt is awake/alert, moves all extremitiesx4 EXTREMITIES: pulses normal, full ROM, tenderness with ROM of left hip, All other extremities/joints palpated/ranged and nontender, no deformity noted SKIN: warm, color normal PSYCH: no abnormalities of mood noted  ED Course  Procedures  DIAGNOSTIC STUDIES: Oxygen Saturation is 97% on room air, adequate by my interpretation.    COORDINATION OF CARE: 10:32 AM- Patient informed of current plan for treatment and evaluation and agrees with plan at this time. Patient advised  to remain supine.   Dg Thoracic Spine 2 View  12/21/2012   *RADIOLOGY REPORT*  Clinical Data: Fall with mid back pain.  THORACIC SPINE - 2 VIEW  Comparison: None  Findings: Normal alignment is noted. There is no evidence of acute fracture or subluxation. Mild multilevel degenerative disc disease noted, greatest in the lower lumbar spine. No focal bony abnormalities are noted.  IMPRESSION: No evidence of acute bony abnormality.  Mild multilevel degenerative disc disease.    Original Report Authenticated By: Harmon Pier, M.D.   Dg Lumbar Spine Complete  12/21/2012   *RADIOLOGY REPORT*  Clinical Data: 52 year old male with low back pain following fall.  LUMBAR SPINE - COMPLETE 4+ VIEW  Comparison: 01/12/2011 lumbar spine  Findings: Five non-rib bearing lumbar type vertebra are again identified in normal alignment. There is no evidence of acute fracture or subluxation. Moderate to severe degenerative disc disease and facet arthropathy and L5-S1 again noted. No focal bony lesions or spondylolysis present.  IMPRESSION: No evidence of acute bony abnormality.  Moderate to severe degenerative changes at L5-S1.   Original Report Authenticated By: Harmon Pier, M.D.   Dg Hip Complete Left  12/21/2012   *RADIOLOGY REPORT*  Clinical Data: Fall with left hip pain.  History of previous left hip injury and surgery.  LEFT HIP - COMPLETE 2+ VIEW  Comparison: 11/24/2012  Findings: Internal fixation surgical hardware within the left acetabulum and left femur intertrochanteric region noted. There is no evidence of acute fracture or subluxation. No complicating hardware features are identified. No focal bony lesions are present. Degenerative changes within the left hip are noted.  IMPRESSION: No evidence of acute abnormality.  Left hip surgical hardware without definite complicating features.   Original Report Authenticated By: Harmon Pier, M.D.    Pt improved He ambulates without difficulty No other new complaints Short course of pain meds given He requests copies of xray results for his outpatient orthopedic care    MDM  Nursing notes including past medical history and social history reviewed and considered in documentation xrays reviewed and considered        I personally performed the services described in this documentation, which was scribed in my presence. The recorded information has been reviewed and is accurate.      Joya Gaskins, MD 12/21/12 [id number]

## 2012-12-21 NOTE — ED Notes (Signed)
Pt c/o back and hip pain, states that he missed a step climbing down a ladder yesterday causing him to fall, denies any LOC, Dr. Bebe Shaggy in prior to RN, see edp assessment for further.

## 2012-12-21 NOTE — ED Notes (Signed)
Pt fell yesterday approx 2 feet from ladder, denies loc, "hurting all over", mainly lower back, left hip.

## 2013-03-16 ENCOUNTER — Emergency Department (HOSPITAL_COMMUNITY)
Admission: EM | Admit: 2013-03-16 | Discharge: 2013-03-16 | Disposition: A | Discharge status: Home / Self Care | Payer: Self-pay | Attending: Emergency Medicine | Admitting: Emergency Medicine

## 2013-03-16 ENCOUNTER — Emergency Department (HOSPITAL_COMMUNITY): Payer: Self-pay

## 2013-03-16 ENCOUNTER — Encounter (HOSPITAL_COMMUNITY): Payer: Self-pay | Admitting: Emergency Medicine

## 2013-03-16 DIAGNOSIS — R52 Pain, unspecified: Secondary | ICD-10-CM | POA: Insufficient documentation

## 2013-03-16 DIAGNOSIS — M25469 Effusion, unspecified knee: Secondary | ICD-10-CM | POA: Insufficient documentation

## 2013-03-16 DIAGNOSIS — R1011 Right upper quadrant pain: Secondary | ICD-10-CM

## 2013-03-16 DIAGNOSIS — K828 Other specified diseases of gallbladder: Secondary | ICD-10-CM | POA: Insufficient documentation

## 2013-03-16 DIAGNOSIS — Z8709 Personal history of other diseases of the respiratory system: Secondary | ICD-10-CM | POA: Insufficient documentation

## 2013-03-16 DIAGNOSIS — R7989 Other specified abnormal findings of blood chemistry: Secondary | ICD-10-CM

## 2013-03-16 DIAGNOSIS — D135 Benign neoplasm of extrahepatic bile ducts: Secondary | ICD-10-CM

## 2013-03-16 DIAGNOSIS — M2550 Pain in unspecified joint: Secondary | ICD-10-CM | POA: Insufficient documentation

## 2013-03-16 DIAGNOSIS — M549 Dorsalgia, unspecified: Secondary | ICD-10-CM | POA: Insufficient documentation

## 2013-03-16 DIAGNOSIS — R945 Abnormal results of liver function studies: Secondary | ICD-10-CM | POA: Insufficient documentation

## 2013-03-16 DIAGNOSIS — F17200 Nicotine dependence, unspecified, uncomplicated: Secondary | ICD-10-CM | POA: Insufficient documentation

## 2013-03-16 DIAGNOSIS — G8929 Other chronic pain: Secondary | ICD-10-CM | POA: Insufficient documentation

## 2013-03-16 DIAGNOSIS — F1010 Alcohol abuse, uncomplicated: Secondary | ICD-10-CM

## 2013-03-16 DIAGNOSIS — Z720 Tobacco use: Secondary | ICD-10-CM

## 2013-03-16 DIAGNOSIS — D7589 Other specified diseases of blood and blood-forming organs: Secondary | ICD-10-CM

## 2013-03-16 LAB — COMPREHENSIVE METABOLIC PANEL
ALT: 21 U/L (ref 0–53)
AST: 47 U/L — ABNORMAL HIGH (ref 0–37)
Albumin: 3.8 g/dL (ref 3.5–5.2)
Alkaline Phosphatase: 150 U/L — ABNORMAL HIGH (ref 39–117)
Chloride: 101 mEq/L (ref 96–112)
Potassium: 4.5 mEq/L (ref 3.5–5.1)
Sodium: 141 mEq/L (ref 135–145)
Total Bilirubin: 0.5 mg/dL (ref 0.3–1.2)

## 2013-03-16 LAB — CBC WITH DIFFERENTIAL/PLATELET
Basophils Absolute: 0.1 10*3/uL (ref 0.0–0.1)
Basophils Relative: 1 % (ref 0–1)
Hemoglobin: 14.9 g/dL (ref 13.0–17.0)
MCHC: 35.1 g/dL (ref 30.0–36.0)
Monocytes Relative: 7 % (ref 3–12)
Neutro Abs: 3.3 10*3/uL (ref 1.7–7.7)
Neutrophils Relative %: 58 % (ref 43–77)
Platelets: 308 10*3/uL (ref 150–400)
RDW: 13.9 % (ref 11.5–15.5)

## 2013-03-16 LAB — URINALYSIS, ROUTINE W REFLEX MICROSCOPIC
Bilirubin Urine: NEGATIVE
Glucose, UA: NEGATIVE mg/dL
Ketones, ur: NEGATIVE mg/dL
Specific Gravity, Urine: >1.03 — ABNORMAL HIGH (ref 1.005–1.030)
pH: 5.5 (ref 5.0–8.0)

## 2013-03-16 MED ORDER — ONDANSETRON HCL 4 MG/2ML IJ SOLN
4.0000 mg | Freq: Once | INTRAMUSCULAR | Status: AC
  Administered 2013-03-16: 4 mg via INTRAVENOUS
  Filled 2013-03-16: qty 2

## 2013-03-16 MED ORDER — SODIUM CHLORIDE 0.9 % IV SOLN
INTRAVENOUS | Status: DC
  Administered 2013-03-16: 12:00:00 via INTRAVENOUS

## 2013-03-16 MED ORDER — ONDANSETRON HCL 4 MG PO TABS: 4.0000 mg | ORAL_TABLET | Freq: Three times a day (TID) | ORAL | Status: DC | PRN

## 2013-03-16 MED ORDER — IOHEXOL 300 MG/ML  SOLN
50.0000 mL | Freq: Once | INTRAMUSCULAR | Status: AC | PRN
  Administered 2013-03-16: 50 mL via ORAL

## 2013-03-16 MED ORDER — MORPHINE SULFATE 4 MG/ML IJ SOLN
4.0000 mg | Freq: Once | INTRAMUSCULAR | Status: AC
  Administered 2013-03-16: 4 mg via INTRAVENOUS
  Filled 2013-03-16: qty 1

## 2013-03-16 MED ORDER — IOHEXOL 300 MG/ML  SOLN
100.0000 mL | Freq: Once | INTRAMUSCULAR | Status: AC | PRN
  Administered 2013-03-16: 100 mL via INTRAVENOUS

## 2013-03-16 MED ORDER — OXYCODONE-ACETAMINOPHEN 5-325 MG PO TABS: ORAL_TABLET | ORAL | Status: DC

## 2013-03-16 MED ORDER — SODIUM CHLORIDE 0.9 % IV BOLUS (SEPSIS)
500.0000 mL | Freq: Once | INTRAVENOUS | Status: AC
  Administered 2013-03-16: 11:00:00 via INTRAVENOUS

## 2013-03-16 NOTE — ED Provider Notes (Addendum)
CSN: [id number]     Arrival date & time 03/16/13  4098 History  This chart was scribed for Ward Givens, MD by Greggory Stallion, ED Scribe. This patient was seen in room APA19/APA19 and the patient's care was started at 10:07 AM.   Chief Complaint  Patient presents with  . Generalized Body Aches  . Abdominal Pain   The history is provided by the patient. No language interpreter was used.    HPI Comments: Dylan Dalton is a 52 y.o. male who presents to the Emergency Department complaining of gradual onset, constant RUQ abdominal pain that started 3 days ago after eating a tomato sandwich. He states sitting still worsens the pain. Pt states nothing makes it better. His wife states he has had a mild fever subjectively. He has bruising and swelling in his bilateral knees and flanks that also started 3 days ago. He states he normally does not bruise easily. On Wednesday, pt states he had a very sharp pain in his RLQ. Pt states he is having different back pain from his normal chronic pain. Pt denies nausea, emesis, diarrhea, hematochezia and hematuria as associated symptoms. He he used his inhaler Friday morning because he wasn't breathing well. Pt smokes about 0.5 packs of cigarettes a day and drinks about 1/5 of liquor per week. He had about 4 shots last night. Pt has never been told he has liver problems such as cirrhosis. States he had a liver bx done at age 51 yo.   He does not have a PCP.  Past Medical History  Diagnosis Date  . Bronchitis   . Chronic back pain    Past Surgical History  Procedure Laterality Date  . Hip surgery     Family History  Problem Relation Age of Onset  . Diabetes Mother   . Hyperlipidemia Mother   . Hypertension Mother   . Arthritis    . Asthma     History  Substance Use Topics  . Smoking status: Current Every Day Smoker -- 0.50 packs/day    Types: Cigarettes  . Smokeless tobacco: Not on file  . Alcohol Use: Yes     Comment: occ.  lives at home  Lives  with spouse Employed in [employer]  Review of Systems  Gastrointestinal: Positive for abdominal pain. Negative for nausea, vomiting and blood in stool.  Genitourinary: Negative for hematuria.  Musculoskeletal: Positive for back pain, joint swelling (bilateral knees) and arthralgias.  All other systems reviewed and are negative.    Allergies  Aspirin; Codeine; and Oxycodone-acetaminophen  Home Medications   Current Outpatient Rx  Name  Route  Sig  Dispense  Refill  . albuterol (PROVENTIL HFA;VENTOLIN HFA) 108 (90 BASE) MCG/ACT inhaler   Inhalation   Inhale 2 puffs into the lungs every 6 (six) hours as needed. For shortness of breath         . HYDROcodone-acetaminophen (NORCO/VICODIN) 5-325 MG per tablet   Oral   Take 1 tablet by mouth every 4 (four) hours as needed for pain.   15 tablet   0    BP 125/86  Pulse 70  Temp(Src) 97.8 F (36.6 C) (Oral)  Resp 16  Wt 150 lb (68.04 kg)  BMI 24.22 kg/m2  SpO2 98%  Vital signs normal    Physical Exam  Nursing note and vitals reviewed. Constitutional: He is oriented to person, place, and time. He appears well-developed and well-nourished.  Non-toxic appearance. He does not appear ill. No distress.  HENT:  Head:  Normocephalic and atraumatic.  Right Ear: External ear normal.  Left Ear: External ear normal.  Nose: Nose normal. No mucosal edema or rhinorrhea.  Mouth/Throat: Oropharynx is clear and moist and mucous membranes are normal. No dental abscesses or edematous.  Eyes: Conjunctivae and EOM are normal. Pupils are equal, round, and reactive to light.  Neck: Normal range of motion and full passive range of motion without pain. Neck supple.  Cardiovascular: Normal rate, regular rhythm and normal heart sounds.  Exam reveals no gallop and no friction rub.   No murmur heard. Pulmonary/Chest: Effort normal and breath sounds normal. No respiratory distress. He has no wheezes. He has no rhonchi. He has no rales. He exhibits no  tenderness and no crepitus.  Abdominal: Soft. Normal appearance and bowel sounds are normal. He exhibits no distension. There is no tenderness. There is no rebound and no guarding.  Liver size is about 15 cm.   Musculoskeletal: Normal range of motion. He exhibits no edema.  Moves all extremities well. No effusion to bilateral knees.   Neurological: He is alert and oriented to person, place, and time. He has normal strength. No cranial nerve deficit.  Skin: Skin is warm, dry and intact. No rash noted. No erythema. No pallor.  Scattered yellow bruising on bilateral posterior ribcage.   Psychiatric: He has a normal mood and affect. His speech is normal and behavior is normal. His mood appears not anxious.    ED Course   Procedures (including critical care time)  Medications  0.9 %  sodium chloride infusion ( Intravenous New Bag/Given 03/16/13 1152)  morphine 4 MG/ML injection 4 mg (4 mg Intravenous Given 03/16/13 1053)  ondansetron (ZOFRAN) injection 4 mg (4 mg Intravenous Given 03/16/13 1053)  sodium chloride 0.9 % bolus 500 mL (0 mLs Intravenous Stopped 03/16/13 1152)  iohexol (OMNIPAQUE) 300 MG/ML solution 50 mL (50 mLs Oral Contrast Given 03/16/13 1155)  iohexol (OMNIPAQUE) 300 MG/ML solution 100 mL (100 mLs Intravenous Contrast Given 03/16/13 1156)  morphine 4 MG/ML injection 4 mg (4 mg Intravenous Given 03/16/13 1455)    DIAGNOSTIC STUDIES: Oxygen Saturation is 98% on RA, normal by my interpretation.    COORDINATION OF CARE: 10:37 AM-Discussed treatment plan which includes IV fluids, CT scan and UA with pt at bedside and pt agreed to plan.   Results for orders placed during the hospital encounter of 03/16/13  CBC WITH DIFFERENTIAL      Result Value Range   WBC 5.8  4.0 - 10.5 K/uL   RBC 3.97 (*) 4.22 - 5.81 MIL/uL   Hemoglobin 14.9  13.0 - 17.0 g/dL   HCT 16.1  09.6 - 04.5 %   MCV 106.8 (*) 78.0 - 100.0 fL   MCH 37.5 (*) 26.0 - 34.0 pg   MCHC 35.1  30.0 - 36.0 g/dL   RDW 40.9   81.1 - 91.4 %   Platelets 308  150 - 400 K/uL   Neutrophils Relative % 58  43 - 77 %   Neutro Abs 3.3  1.7 - 7.7 K/uL   Lymphocytes Relative 31  12 - 46 %   Lymphs Abs 1.8  0.7 - 4.0 K/uL   Monocytes Relative 7  3 - 12 %   Monocytes Absolute 0.4  0.1 - 1.0 K/uL   Eosinophils Relative 3  0 - 5 %   Eosinophils Absolute 0.2  0.0 - 0.7 K/uL   Basophils Relative 1  0 - 1 %   Basophils Absolute 0.1  0.0 - 0.1 K/uL  COMPREHENSIVE METABOLIC PANEL      Result Value Range   Sodium 141  135 - 145 mEq/L   Potassium 4.5  3.5 - 5.1 mEq/L   Chloride 101  96 - 112 mEq/L   CO2 30  19 - 32 mEq/L   Glucose, Bld 91  70 - 99 mg/dL   BUN 4 (*) 6 - 23 mg/dL   Creatinine, Ser 1.61  0.50 - 1.35 mg/dL   Calcium 9.9  8.4 - 09.6 mg/dL   Total Protein 7.7  6.0 - 8.3 g/dL   Albumin 3.8  3.5 - 5.2 g/dL   AST 47 (*) 0 - 37 U/L   ALT 21  0 - 53 U/L   Alkaline Phosphatase 150 (*) 39 - 117 U/L   Total Bilirubin 0.5  0.3 - 1.2 mg/dL   GFR calc non Af Amer >90  >90 mL/min   GFR calc Af Amer >90  >90 mL/min  LIPASE, BLOOD      Result Value Range   Lipase 25  11 - 59 U/L  URINALYSIS, ROUTINE W REFLEX MICROSCOPIC      Result Value Range   Color, Urine YELLOW  YELLOW   APPearance CLEAR  CLEAR   Specific Gravity, Urine >1.030 (*) 1.005 - 1.030   pH 5.5  5.0 - 8.0   Glucose, UA NEGATIVE  NEGATIVE mg/dL   Hgb urine dipstick NEGATIVE  NEGATIVE   Bilirubin Urine NEGATIVE  NEGATIVE   Ketones, ur NEGATIVE  NEGATIVE mg/dL   Protein, ur NEGATIVE  NEGATIVE mg/dL   Urobilinogen, UA 0.2  0.0 - 1.0 mg/dL   Nitrite NEGATIVE  NEGATIVE   Leukocytes, UA NEGATIVE  NEGATIVE  ETHANOL      Result Value Range   Alcohol, Ethyl (B) 119 (*) 0 - 11 mg/dL   Laboratory interpretation all normal except minor elevation of lft's, alcohol intoxication, elevated MCV c/w folic acid/Vit B12 deficiency from alcoholism   Ct Abdomen Pelvis W Contrast  03/16/2013   *RADIOLOGY REPORT*  Clinical Data: Right-sided abdominal pain.  Hepatomegaly  on exam.  CT ABDOMEN AND PELVIS WITH CONTRAST  Technique:  Multidetector CT imaging of the abdomen and pelvis was performed following the standard protocol during bolus administration of intravenous contrast.  Contrast: 50mL OMNIPAQUE IOHEXOL 300 MG/ML  SOLN, OMNIPAQUE IOHEXOL 300 MG/ML  SOLN  Comparison: None.  Findings: Subpleural scarring or subsegmental atelectasis posteriorly in the visualized lung bases.  Unremarkable liver, spleen, adrenal glands, kidneys, pancreas.  There is marked attenuation of the lumen in the gallbladder fundus with marked wall thickening up to 12 mm, mucosal enhancement, and poorly defined peripheral margin of the gallbladder wall.  There is a very sharp transition to a basically normal appearing gallbladder in the more central aspect down to the level of the cystic duct. There is no intra or extrahepatic biliary ductal dilatation.  Patchy aortoiliac atheromatous plaque without aneurysm or high- grade stenosis.  Portal vein patent.  No adenopathy localized.  The stomach is physiologically distended.  Small bowel and colon nondilated.  Normal appendix.  Scattered diverticula in the ascending, descending, and proximal sigmoid portions of the colon without adjacent inflammatory/edematous change.  Streak artifact from fixation hardware in the left acetabulum and femur.  No ascites.  No free air.  Urinary bladder incompletely distended. Advanced degenerative disc disease L5-S1.  IMPRESSION:  1.  Abnormal gallbladder fundus suggesting localized adenomyomatosis.  Indistinct peripheral margins suggest possible superimposed inflammatory change.  Ultrasound may be useful to exclude   stone and demonstrate   characteristic comet-tail artifact of adenomyomatosis. 2.  Colonic diverticulosis.   Original Report Authenticated By: D. Deanne Coffer III, MD      1. RUQ abdominal pain   2. Adenomyoma of gallbladder   3. Alcohol abuse   4. Tobacco abuse   5. Elevated liver function tests   6.  Macrocytosis without anemia     New Prescriptions   ONDANSETRON (ZOFRAN) 4 MG TABLET    Take 1 tablet (4 mg total) by mouth every 8 (eight) hours as needed for nausea.   OXYCODONE-ACETAMINOPHEN (PERCOCET/ROXICET) 5-325 MG PER TABLET    Take 1 or 2 po Q 6hrs for pain    Plan discharge   Devoria Albe, MD, FACEP    MDM     I personally performed the services described in this documentation, which was scribed in my presence. The recorded information has been reviewed and considered.  Devoria Albe, MD, FACEP   Ward Givens, MD 03/16/13 1542  Ward Givens, MD 03/16/13 [phone]

## 2013-03-16 NOTE — ED Notes (Signed)
States he is having generalized body aches and right mid abdominal pain.  Reports easy bruising and swelling in his bilateral knees.

## 2013-03-17 ENCOUNTER — Ambulatory Visit (HOSPITAL_COMMUNITY)
Admit: 2013-03-17 | Discharge: 2013-03-17 | Disposition: A | Discharge status: Home / Self Care | Payer: Self-pay | Attending: Emergency Medicine | Admitting: Emergency Medicine

## 2013-03-17 DIAGNOSIS — R1011 Right upper quadrant pain: Secondary | ICD-10-CM | POA: Insufficient documentation

## 2013-03-19 ENCOUNTER — Encounter (HOSPITAL_COMMUNITY): Payer: Self-pay

## 2013-03-19 ENCOUNTER — Emergency Department (HOSPITAL_COMMUNITY): Payer: Self-pay

## 2013-03-19 ENCOUNTER — Emergency Department (HOSPITAL_COMMUNITY)
Admission: EM | Admit: 2013-03-19 | Discharge: 2013-03-19 | Disposition: A | Discharge status: Home / Self Care | Payer: Self-pay | Attending: Emergency Medicine | Admitting: Emergency Medicine

## 2013-03-19 DIAGNOSIS — R109 Unspecified abdominal pain: Secondary | ICD-10-CM

## 2013-03-19 DIAGNOSIS — Z8709 Personal history of other diseases of the respiratory system: Secondary | ICD-10-CM | POA: Insufficient documentation

## 2013-03-19 DIAGNOSIS — Z79899 Other long term (current) drug therapy: Secondary | ICD-10-CM | POA: Insufficient documentation

## 2013-03-19 DIAGNOSIS — G8929 Other chronic pain: Secondary | ICD-10-CM | POA: Insufficient documentation

## 2013-03-19 DIAGNOSIS — R112 Nausea with vomiting, unspecified: Secondary | ICD-10-CM | POA: Insufficient documentation

## 2013-03-19 DIAGNOSIS — R1031 Right lower quadrant pain: Secondary | ICD-10-CM | POA: Insufficient documentation

## 2013-03-19 DIAGNOSIS — F17200 Nicotine dependence, unspecified, uncomplicated: Secondary | ICD-10-CM | POA: Insufficient documentation

## 2013-03-19 LAB — CBC WITH DIFFERENTIAL/PLATELET
Eosinophils Absolute: 0.2 10*3/uL (ref 0.0–0.7)
Eosinophils Relative: 3 % (ref 0–5)
Hemoglobin: 13.7 g/dL (ref 13.0–17.0)
Lymphocytes Relative: 22 % (ref 12–46)
Lymphs Abs: 1.3 10*3/uL (ref 0.7–4.0)
MCH: 37.1 pg — ABNORMAL HIGH (ref 26.0–34.0)
MCV: 108.4 fL — ABNORMAL HIGH (ref 78.0–100.0)
Monocytes Relative: 9 % (ref 3–12)
Neutrophils Relative %: 65 % (ref 43–77)
RBC: 3.69 MIL/uL — ABNORMAL LOW (ref 4.22–5.81)
WBC: 5.9 10*3/uL (ref 4.0–10.5)

## 2013-03-19 LAB — COMPREHENSIVE METABOLIC PANEL
ALT: 15 U/L (ref 0–53)
Alkaline Phosphatase: 127 U/L — ABNORMAL HIGH (ref 39–117)
BUN: 7 mg/dL (ref 6–23)
CO2: 28 mEq/L (ref 19–32)
GFR calc Af Amer: >90 mL/min (ref >90)
GFR calc non Af Amer: >90 mL/min (ref >90)
Glucose, Bld: 90 mg/dL (ref 70–99)
Potassium: 4.4 mEq/L (ref 3.5–5.1)
Sodium: 140 mEq/L (ref 135–145)
Total Bilirubin: 0.8 mg/dL (ref 0.3–1.2)
Total Protein: 6.8 g/dL (ref 6.0–8.3)

## 2013-03-19 LAB — LIPASE, BLOOD: Lipase: 23 U/L (ref 11–59)

## 2013-03-19 MED ORDER — DICYCLOMINE HCL 20 MG PO TABS: 20.0000 mg | ORAL_TABLET | Freq: Four times a day (QID) | ORAL | Status: DC

## 2013-03-19 MED ORDER — IOHEXOL 300 MG/ML  SOLN
50.0000 mL | Freq: Once | INTRAMUSCULAR | Status: AC | PRN
  Administered 2013-03-19: 50 mL via ORAL

## 2013-03-19 MED ORDER — PROMETHAZINE HCL 25 MG PO TABS: 25.0000 mg | ORAL_TABLET | Freq: Four times a day (QID) | ORAL | Status: DC | PRN

## 2013-03-19 MED ORDER — DICYCLOMINE HCL 10 MG PO CAPS
20.0000 mg | ORAL_CAPSULE | Freq: Once | ORAL | Status: AC
  Administered 2013-03-19: 20 mg via ORAL
  Filled 2013-03-19 (×2): qty 1

## 2013-03-19 MED ORDER — OXYCODONE-ACETAMINOPHEN 5-325 MG PO TABS
1.0000 | ORAL_TABLET | Freq: Once | ORAL | Status: AC
  Administered 2013-03-19: 1 via ORAL
  Filled 2013-03-19: qty 1

## 2013-03-19 MED ORDER — OXYCODONE-ACETAMINOPHEN 5-325 MG PO TABS: 1.0000 | ORAL_TABLET | ORAL | Status: DC | PRN

## 2013-03-19 MED ORDER — ONDANSETRON 8 MG PO TBDP
8.0000 mg | ORAL_TABLET | Freq: Once | ORAL | Status: AC
  Administered 2013-03-19: 8 mg via ORAL
  Filled 2013-03-19: qty 1

## 2013-03-19 NOTE — ED Provider Notes (Signed)
CSN: [id number]     Arrival date & time 03/19/13  1041 History     First MD Initiated Contact with Patient 03/19/13 1110     Chief Complaint  Patient presents with  . Abdominal Pain   (Consider location/radiation/quality/duration/timing/severity/associated sxs/prior Treatment) HPI Comments: Dylan Dalton is a 52 y.o. Male presenting with right mid to lower abdominal pain with nausea and vomiting which has been persistent for the past 7 days.  His pain started out gradually and has been persistent for the past 4 days. Splinting the site and ambulation eases his symptoms and sitting still seems to escalate his pain.  He has had persistent nausea with emesis anytime he tried to eat as well, despite the zofran he was prescribed at his visit 3 days ago for this same complaint.  At that time he had been self medicating with etoh.  He drinks about 1/5 of liquor per week,  Has drank every night since his symptoms started.  He denies fevers, chills, diarrhea or constipation, his last bm occuring 2 days ago.  He also denies hemetemesis,  Blood in stool and has no urinary symptoms.  He had a Ct scan 3 days ago which showed thickening of the gallbladder fundus,  But a followup US completed 2 days ago was unremarkable.  He denies having any history of liver problems.  He does report having bruising of his back and flank area after working in a tight crawl space last week which has resolved, no other unexplained bruising.    The history is provided by the patient and the spouse.    Past Medical History  Diagnosis Date  . Bronchitis   . Chronic back pain    Past Surgical History  Procedure Laterality Date  . Hip surgery     Family History  Problem Relation Age of Onset  . Diabetes Mother   . Hyperlipidemia Mother   . Hypertension Mother   . Arthritis    . Asthma     History  Substance Use Topics  . Smoking status: Current Every Day Smoker -- 0.50 packs/day    Types: Cigarettes  . Smokeless  tobacco: Not on file  . Alcohol Use: Yes     Comment: occ.    Review of Systems  Constitutional: Negative for fever and chills.  HENT: Negative for congestion, sore throat and neck pain.   Eyes: Negative.   Respiratory: Negative for chest tightness and shortness of breath.   Cardiovascular: Negative for chest pain.  Gastrointestinal: Positive for nausea, vomiting and abdominal pain. Negative for diarrhea, constipation, anal bleeding and rectal pain.  Genitourinary: Negative.  Negative for dysuria.  Musculoskeletal: Negative for joint swelling and arthralgias.  Skin: Negative.  Negative for rash and wound.  Neurological: Negative for dizziness, weakness, light-headedness, numbness and headaches.  Psychiatric/Behavioral: Negative.     Allergies  Aspirin and Codeine  Home Medications   Current Outpatient Rx  Name  Route  Sig  Dispense  Refill  . acetaminophen (TYLENOL) 500 MG tablet   Oral   Take 1,000 mg by mouth every 6 (six) hours as needed for pain.         Marland Kitchen albuterol (PROVENTIL HFA;VENTOLIN HFA) 108 (90 BASE) MCG/ACT inhaler   Inhalation   Inhale 2 puffs into the lungs every 6 (six) hours as needed. For shortness of breath         . ibuprofen (ADVIL,MOTRIN) 200 MG tablet   Oral   Take 400 mg by mouth every  6 (six) hours as needed for pain.         Marland Kitchen ondansetron (ZOFRAN) 4 MG tablet   Oral   Take 1 tablet (4 mg total) by mouth every 8 (eight) hours as needed for nausea.   12 tablet   0   . oxyCODONE-acetaminophen (PERCOCET/ROXICET) 5-325 MG per tablet      Take 1 or 2 po Q 6hrs for pain   15 tablet   0   . dicyclomine (BENTYL) 20 MG tablet   Oral   Take 1 tablet (20 mg total) by mouth every 6 (six) hours.   20 tablet   0   . oxyCODONE-acetaminophen (PERCOCET/ROXICET) 5-325 MG per tablet   Oral   Take 1 tablet by mouth every 4 (four) hours as needed for pain.   10 tablet   0   . promethazine (PHENERGAN) 25 MG tablet   Oral   Take 1 tablet (25 mg  total) by mouth every 6 (six) hours as needed for nausea.   20 tablet   0    BP 145/95  Pulse 62  Temp(Src) 97.8 F (36.6 C) (Oral)  Resp 18  Ht 5\' 6"  (1.676 m)  Wt 150 lb (68.04 kg)  BMI 24.22 kg/m2  SpO2 97% Physical Exam  Nursing note and vitals reviewed. Constitutional: He appears well-developed and well-nourished.  HENT:  Head: Normocephalic and atraumatic.  Eyes: Conjunctivae are normal.  Neck: Normal range of motion.  Cardiovascular: Normal rate, regular rhythm, normal heart sounds and intact distal pulses.   Pulmonary/Chest: Effort normal and breath sounds normal. He has no wheezes.  Abdominal: Soft. Bowel sounds are normal. He exhibits distension. He exhibits no mass. There is tenderness in the right lower quadrant. There is no rigidity, no rebound, no guarding and no tenderness at McBurney's point.  No palpable stool mass.  Musculoskeletal: Normal range of motion.  Neurological: He is alert.  Skin: Skin is warm and dry.  Psychiatric: He has a normal mood and affect.    ED Course   Procedures (including critical care time)  Labs Reviewed  CBC WITH DIFFERENTIAL - Abnormal; Notable for the following:    RBC 3.69 (*)    MCV 108.4 (*)    MCH 37.1 (*)    All other components within normal limits  COMPREHENSIVE METABOLIC PANEL - Abnormal; Notable for the following:    Alkaline Phosphatase 127 (*)    All other components within normal limits  LIPASE, BLOOD   Ct Abdomen Pelvis Wo Contrast  03/19/2013   *RADIOLOGY REPORT*  Clinical Data: Right lower quadrant pain  CT ABDOMEN AND PELVIS WITHOUT CONTRAST  Technique:  Multidetector CT imaging of the abdomen and pelvis was performed following the standard protocol without intravenous contrast.  Comparison: 03/16/2013  Findings: The lung bases are free of acute infiltrate or sizable effusion.  The liver, gallbladder, spleen, pancreas and adrenal glands are all stable from the previous exam.  There remains some fullness in  the fundus of the gallbladder.  Diverticular change of the colon is seen.  No diverticulitis is noted.  The appendix is within normal limits.  The bladder is distended without opacified urine.  No pelvic mass lesion or side wall abnormality is noted.  Postsurgical changes are noted in the left acetabulum.  IMPRESSION: When compared with the study from 3 days previous, no significant interval change is noted.   Thickening of the gallbladder fundus is again identified but was not borne out on recent ultrasound  examination.   Original Report Authenticated By: Alcide Clever, M.D.   1. Abdominal pain     MDM  Patients labs and/or radiological studies were viewed and considered during the medical decision making and disposition process. Old ct and [place] reviewed as well.  Discussed case with Dr Clarene Duke prior to repeating Ct scan today which is negative with no evidence of abscess/ obstruction or other source of abd pain.  Suspect this could be functional.  He was given bentyl prior to discharge along with script for same if this med is helpful.  Prescribed phenergan in place of zofran.  Pt asked for oxycodone for home use.  #10 prescribed.  Referrals for obtaining pcp given,  Advised to cut back on etoh use.    The patient appears reasonably screened and/or stabilized for discharge and I doubt any other medical condition or other [facility] requiring further screening, evaluation, or treatment in the ED at this time prior to discharge.   Burgess Amor, PA-C 03/19/13 [phone]

## 2013-03-19 NOTE — ED Notes (Signed)
Pt c/o pain in r side of abd since last Thursday.  Reports has had n/v.  Denies diarrhea.  LBM was "a couple of days ago."  Reports was seen here Sunday and came back Monday for an [place].

## 2013-03-19 NOTE — ED Provider Notes (Signed)
Medical screening examination/treatment/procedure(s) were performed by non-physician practitioner and as supervising physician I was immediately available for consultation/collaboration.   Kayia Billinger M Savahna Casados, DO 03/19/13 2218 

## 2013-03-19 NOTE — ED Notes (Signed)
Pa idol at bedside of pt.

## 2013-03-19 NOTE — ED Notes (Signed)
Pt requested additional meds for pain"this pill hasn't doen anything for me yet". Pa notified and meds ordered.

## 2013-03-19 NOTE — Progress Notes (Signed)
ED/CM noted pt did not have health insurance, and/or PCP. Patient was given the ED [place] uninsured handout with information for the clinics, food pantries, and the handout for insurance sign-up. Patient expressed appreciation for this.      

## 2014-01-14 ENCOUNTER — Emergency Department (HOSPITAL_COMMUNITY): Payer: Self-pay

## 2014-01-14 ENCOUNTER — Emergency Department (HOSPITAL_COMMUNITY)
Admission: EM | Admit: 2014-01-14 | Discharge: 2014-01-14 | Disposition: A | Discharge status: Home / Self Care | Payer: Self-pay | Attending: Emergency Medicine | Admitting: Emergency Medicine

## 2014-01-14 ENCOUNTER — Encounter (HOSPITAL_COMMUNITY): Payer: Self-pay | Admitting: Emergency Medicine

## 2014-01-14 DIAGNOSIS — R079 Chest pain, unspecified: Secondary | ICD-10-CM | POA: Insufficient documentation

## 2014-01-14 DIAGNOSIS — J40 Bronchitis, not specified as acute or chronic: Secondary | ICD-10-CM

## 2014-01-14 DIAGNOSIS — J209 Acute bronchitis, unspecified: Secondary | ICD-10-CM | POA: Insufficient documentation

## 2014-01-14 DIAGNOSIS — IMO0002 Reserved for concepts with insufficient information to code with codable children: Secondary | ICD-10-CM | POA: Insufficient documentation

## 2014-01-14 DIAGNOSIS — F17200 Nicotine dependence, unspecified, uncomplicated: Secondary | ICD-10-CM | POA: Insufficient documentation

## 2014-01-14 DIAGNOSIS — G8929 Other chronic pain: Secondary | ICD-10-CM | POA: Insufficient documentation

## 2014-01-14 DIAGNOSIS — Z79899 Other long term (current) drug therapy: Secondary | ICD-10-CM | POA: Insufficient documentation

## 2014-01-14 DIAGNOSIS — M549 Dorsalgia, unspecified: Secondary | ICD-10-CM | POA: Insufficient documentation

## 2014-01-14 DIAGNOSIS — M19049 Primary osteoarthritis, unspecified hand: Secondary | ICD-10-CM | POA: Insufficient documentation

## 2014-01-14 MED ORDER — PREDNISONE 10 MG PO TABS: 40.0000 mg | ORAL_TABLET | Freq: Every day | ORAL | Status: DC

## 2014-01-14 MED ORDER — AZITHROMYCIN 250 MG PO TABS: 250.0000 mg | ORAL_TABLET | Freq: Every day | ORAL | Status: DC

## 2014-01-14 MED ORDER — ALBUTEROL SULFATE HFA 108 (90 BASE) MCG/ACT IN AERS
2.0000 | INHALATION_SPRAY | Freq: Four times a day (QID) | RESPIRATORY_TRACT | Status: DC | PRN
  Administered 2014-01-14: 2 via RESPIRATORY_TRACT
  Filled 2014-01-14 (×2): qty 6.7

## 2014-01-14 MED ORDER — ALBUTEROL SULFATE (2.5 MG/3ML) 0.083% IN NEBU
2.5000 mg | INHALATION_SOLUTION | Freq: Once | RESPIRATORY_TRACT | Status: AC
  Administered 2014-01-14: 2.5 mg via RESPIRATORY_TRACT
  Filled 2014-01-14: qty 3

## 2014-01-14 MED ORDER — IPRATROPIUM-ALBUTEROL 0.5-2.5 (3) MG/3ML IN SOLN
3.0000 mL | Freq: Once | RESPIRATORY_TRACT | Status: AC
  Administered 2014-01-14: 3 mL via RESPIRATORY_TRACT
  Filled 2014-01-14: qty 3

## 2014-01-14 MED ORDER — PREDNISONE 50 MG PO TABS
60.0000 mg | ORAL_TABLET | Freq: Once | ORAL | Status: AC
  Administered 2014-01-14: 60 mg via ORAL
  Filled 2014-01-14 (×2): qty 1

## 2014-01-14 NOTE — ED Provider Notes (Signed)
CSN: [id number]     Arrival date & time 01/14/14  1009 History  This chart was scribed for Fredia Sorrow, MD by Jeanell Sparrow, ED Scribe. This patient was seen in room APA03/APA03 and the patient's care was started at 11:40 AM.    Chief Complaint  Patient presents with  . Cough    Patient is a 53 y.o. male presenting with cough. The history is provided by the patient and the spouse. No language interpreter was used.  Cough Cough characteristics:  Productive Sputum characteristics:  Yellow and green Severity:  Moderate Onset quality:  Gradual Duration:  8 days Timing:  Intermittent Chronicity:  Chronic Relieved by:  Home nebulizer Worsened by:  Nothing tried Associated symptoms: chest pain and shortness of breath   Associated symptoms: no chills, no fever, no headaches, no rash, no rhinorrhea and no sore throat    HPI Comments: Dylan Dalton is a 53 y.o. male with a hx of smoking and bronchitis who presents to the Emergency Department complaining of  Intermittent cough and SOB that started about 7 days ago. He states that he was given a breathing treatment in the ED today which provided relief. Spouse states that pt has ran out of albuterol. The cough is productive of yellow-green phlegm. He states that there is pain in his chest while breathing.  Pt also complains of intermittent, moderate pain in both of his hands that is exacerbated with movement. He states that pain is generalized, but is mostly around knuckles. He also reports intermittent numbness. He states that there are knots in his hands. He reports that he has trouble gripping objects.    Past Medical History  Diagnosis Date  . Bronchitis   . Chronic back pain    Past Surgical History  Procedure Laterality Date  . Hip surgery     Family History  Problem Relation Age of Onset  . Diabetes Mother   . Hyperlipidemia Mother   . Hypertension Mother   . Arthritis    . Asthma     History  Substance Use Topics  .  Smoking status: Current Every Day Smoker -- 0.50 packs/day    Types: Cigarettes  . Smokeless tobacco: Not on file  . Alcohol Use: Yes     Comment: occ.    Review of Systems  Constitutional: Negative for fever and chills.  HENT: Negative for rhinorrhea and sore throat.   Eyes: Positive for visual disturbance.  Respiratory: Positive for cough and shortness of breath.   Cardiovascular: Positive for chest pain.  Gastrointestinal: Negative for nausea, vomiting, abdominal pain and diarrhea.  Musculoskeletal: Positive for arthralgias and back pain. Negative for neck pain.  Skin: Negative for rash.  Neurological: Negative for headaches.  Hematological: Does not bruise/bleed easily.  Psychiatric/Behavioral: Negative for confusion.     Allergies  Aspirin and Codeine  Home Medications   Prior to Admission medications   Medication Sig Start Date End Date Taking? Authorizing Provider  Multiple Vitamins-Minerals (MULTIVITAMINS THER. W/MINERALS) TABS tablet Take 1 tablet by mouth daily.   Yes Historical Provider, MD  azithromycin (ZITHROMAX) 250 MG tablet Take 1 tablet (250 mg total) by mouth daily. Take first 2 tablets together, then 1 every day until finished. 01/14/14   Fredia Sorrow, MD  predniSONE (DELTASONE) 10 MG tablet Take 4 tablets (40 mg total) by mouth daily. 01/14/14   Fredia Sorrow, MD   BP 137/97  Pulse 70  Temp(Src) 98.1 F (36.7 C) (Oral)  Resp 18  Ht 5'  6" (1.676 m)  Wt 145 lb (65.772 kg)  BMI 23.41 kg/m2  SpO2 96% Physical Exam  Nursing note and vitals reviewed. Constitutional: He is oriented to person, place, and time. He appears well-developed and well-nourished.  HENT:  Head: Normocephalic and atraumatic.  Eyes: EOM are normal.  Neck: Neck supple. No tracheal deviation present.  Cardiovascular: Normal rate and regular rhythm.   Pulmonary/Chest: Effort normal.  Abdominal: Soft. There is no tenderness.  Musculoskeletal: Normal range of motion.  Radial  pulses in both wrists is 2+. No snuff box tenderness. Normal ROM.  Neurological: He is alert and oriented to person, place, and time. No cranial nerve deficit. Coordination normal.  Skin: Skin is warm and dry.  Psychiatric: He has a normal mood and affect. His behavior is normal.    ED Course  Procedures (including critical care time) DIAGNOSTIC STUDIES:    COORDINATION OF CARE: 11:34 AM- Pt advised of plan for treatment and pt agrees.  Results for orders placed during the hospital encounter of 03/19/13  CBC WITH DIFFERENTIAL      Result Value Ref Range   WBC 5.9  4.0 - 10.5 K/uL   RBC 3.69 (*) 4.22 - 5.81 MIL/uL   Hemoglobin 13.7  13.0 - 17.0 g/dL   HCT 40.0  39.0 - 52.0 %   MCV 108.4 (*) 78.0 - 100.0 fL   MCH 37.1 (*) 26.0 - 34.0 pg   MCHC 34.3  30.0 - 36.0 g/dL   RDW 13.9  11.5 - 15.5 %   Platelets 233  150 - 400 K/uL   Neutrophils Relative % 65  43 - 77 %   Neutro Abs 3.8  1.7 - 7.7 K/uL   Lymphocytes Relative 22  12 - 46 %   Lymphs Abs 1.3  0.7 - 4.0 K/uL   Monocytes Relative 9  3 - 12 %   Monocytes Absolute 0.5  0.1 - 1.0 K/uL   Eosinophils Relative 3  0 - 5 %   Eosinophils Absolute 0.2  0.0 - 0.7 K/uL   Basophils Relative 1  0 - 1 %   Basophils Absolute 0.1  0.0 - 0.1 K/uL  COMPREHENSIVE METABOLIC PANEL      Result Value Ref Range   Sodium 140  135 - 145 mEq/L   Potassium 4.4  3.5 - 5.1 mEq/L   Chloride 104  96 - 112 mEq/L   CO2 28  19 - 32 mEq/L   Glucose, Bld 90  70 - 99 mg/dL   BUN 7  6 - 23 mg/dL   Creatinine, Ser 0.97  0.50 - 1.35 mg/dL   Calcium 9.1  8.4 - 10.5 mg/dL   Total Protein 6.8  6.0 - 8.3 g/dL   Albumin 3.5  3.5 - 5.2 g/dL   AST 35  0 - 37 U/L   ALT 15  0 - 53 U/L   Alkaline Phosphatase 127 (*) 39 - 117 U/L   Total Bilirubin 0.8  0.3 - 1.2 mg/dL   GFR calc non Af Amer >90  >90 mL/min   GFR calc Af Amer >90  >90 mL/min  LIPASE, BLOOD      Result Value Ref Range   Lipase 23  11 - 59 U/L   Dg Chest 2 View  01/14/2014   CLINICAL DATA:   Short of breath.  EXAM: CHEST  2 VIEW  COMPARISON:  03/19/2013  FINDINGS: The heart size and mediastinal contours are within normal limits. Both lungs are  clear. The visualized skeletal structures are unremarkable.  IMPRESSION: No active cardiopulmonary disease.   Electronically Signed   By: Kerby Moors M.D.   On: 01/14/2014 10:35    Medications  ipratropium-albuterol (DUONEB) 0.5-2.5 (3) MG/3ML nebulizer solution 3 mL (3 mLs Nebulization Given 01/14/14 1039)  albuterol (PROVENTIL) (2.5 MG/3ML) 0.083% nebulizer solution 2.5 mg (2.5 mg Nebulization Given 01/14/14 1039)  predniSONE (DELTASONE) tablet 60 mg (60 mg Oral Given 01/14/14 1251)     Labs Review Labs Reviewed - No data to display  Imaging Review Dg Chest 2 View  01/14/2014   CLINICAL DATA:  Short of breath.  EXAM: CHEST  2 VIEW  COMPARISON:  03/19/2013  FINDINGS: The heart size and mediastinal contours are within normal limits. Both lungs are clear. The visualized skeletal structures are unremarkable.  IMPRESSION: No active cardiopulmonary disease.   Electronically Signed   By: Kerby Moors M.D.   On: 01/14/2014 10:35   Dg Hand Complete Left  01/14/2014   CLINICAL DATA:  BILATERAL hand pain  EXAM: LEFT HAND - COMPLETE 3+ VIEW  COMPARISON:  None  FINDINGS: Osseous mineralization normal.  Joint space narrowing at second and third MCP joints with spur formation at second MCP joint.  Remaining joint spaces fairly well preserved.  No acute fracture, dislocation or bone destruction.  IMPRESSION: Degenerative changes at LEFT second and third MCP joints.   Electronically Signed   By: Lavonia Dana M.D.   On: 01/14/2014 13:21   Dg Hand Complete Right  01/14/2014   CLINICAL DATA:  Bilateral atraumatic hand pain  EXAM: RIGHT HAND - COMPLETE 3+ VIEW  COMPARISON:  None.  FINDINGS: Three views of the right hand reveal the bones to be adequately mineralized. There is narrowing of the second and third metacarpophalangeal joints. The other  metacarpophalangeal joints are normal as are the interphalangeal joints. The carpometacarpal and radiocarpal and ulnocarpal joints are normal.  IMPRESSION: There is osteoarthritic change of the second and third metacarpophalangeal joints.   Electronically Signed   By: David  Martinique   On: 01/14/2014 13:16     EKG Interpretation   Date/Time:  Wednesday January 14 2014 12:21:06 EDT Ventricular Rate:  58 PR Interval:  137 QRS Duration: 89 QT Interval:  410 QTC Calculation: 403 R Axis:   49 Text Interpretation:  Sinus rhythm Probable anteroseptal infarct, old No  previous ECGs available Confirmed by Hillard Goodwine  MD, Zadkiel Dragan [phone]) on  01/14/2014 12:29:25 PM      MDM   Final diagnoses:  Hand arthritis  Bronchitis    Primary complaint consistent with bronchitis, CXR negative for pneumonia.Will treat with inhaler prednisone and Z-pak. Hand complaint cw with arthritis. Will treat with Naprosyn, ortho follow up.    I personally performed the services described in this documentation, which was scribed in my presence. The recorded information has been reviewed and is accurate.      Fredia Sorrow, MD 01/15/14 1137

## 2014-01-14 NOTE — Discharge Instructions (Signed)
Bronchitis Bronchitis is swelling (inflammation) of the air tubes leading to your lungs (bronchi). This causes mucus and a cough. If the swelling gets bad, you may have trouble breathing. HOME CARE   Rest.  Drink enough fluids to keep your pee (urine) clear or pale yellow (unless you have a condition where you have to watch how much you drink).  Only take medicine as told by your doctor. If you were given antibiotic medicines, finish them even if you start to feel better.  Avoid smoke, irritating chemicals, and strong smells. These make the problem worse. Quit smoking if you smoke. This helps your lungs heal faster.  Use a cool mist humidifier. Change the water in the humidifier every day. You can also sit in the bathroom with hot shower running for 5 10 minutes. Keep the door closed.  See your health care provider as told.  Wash your hands often. GET HELP IF: Your problems do not get better after 1 week. GET HELP RIGHT AWAY IF:   Your fever gets worse.  You have chills.  Your chest hurts.  Your problems breathing get worse.  You have blood in your mucus.  You pass out (faint).  You feel lightheaded.  You have a bad headache.  You throw up (vomit) again and again. MAKE SURE YOU:  Understand these instructions.  Will watch your condition.  Will get help right away if you are not doing well or get worse. Document Released: 01/10/2008 Document Revised: 05/14/2013 Document Reviewed: 03/18/2013 [facility] Patient Information 2014 [place], [place].   Shortness of breath consistent with bronchitis. Take prednisone for the next 5 days usual albuterol inhaler 2 puffs every 6 hours for the next 7 days then as needed. Take antibiotic as directed. X-rays of both her hands do show arthritic changes that probably explains her pain. For this she'll need followup with her regular Dr. resource guide provided. Also referral to orthopedics here locally provided. No reason to go see a Interior and spatial designer.

## 2014-01-14 NOTE — ED Notes (Addendum)
Pt reports productive cough, "hard to catch breathe" x1 month. Pt alert and oriented. nad noted. Airway patent. Pt also reports bilateral hand pain . Pt denies any known injuries.Pt reports chest discomfort is worse with a deep breath.

## 2014-01-14 NOTE — Care Management Note (Signed)
ED/CM noted patient did not have health insurance and/or PCP listed in the computer.  Patient was given the [facility] with information on the clinics, food pantries, and the handout for new health insurance sign-up.  Patient expressed appreciation for information received. Pt was also given a Rx assistance card.

## 2014-07-20 ENCOUNTER — Emergency Department (HOSPITAL_COMMUNITY)
Admission: EM | Admit: 2014-07-20 | Discharge: 2014-07-20 | Disposition: A | Discharge status: Home / Self Care | Payer: Self-pay | Attending: Emergency Medicine | Admitting: Emergency Medicine

## 2014-07-20 ENCOUNTER — Encounter (HOSPITAL_COMMUNITY): Payer: Self-pay | Admitting: Emergency Medicine

## 2014-07-20 ENCOUNTER — Emergency Department (HOSPITAL_COMMUNITY): Payer: Self-pay

## 2014-07-20 DIAGNOSIS — Z720 Tobacco use: Secondary | ICD-10-CM | POA: Insufficient documentation

## 2014-07-20 DIAGNOSIS — R109 Unspecified abdominal pain: Secondary | ICD-10-CM

## 2014-07-20 DIAGNOSIS — G8929 Other chronic pain: Secondary | ICD-10-CM | POA: Insufficient documentation

## 2014-07-20 DIAGNOSIS — Z7952 Long term (current) use of systemic steroids: Secondary | ICD-10-CM | POA: Insufficient documentation

## 2014-07-20 DIAGNOSIS — Z79899 Other long term (current) drug therapy: Secondary | ICD-10-CM | POA: Insufficient documentation

## 2014-07-20 DIAGNOSIS — Z792 Long term (current) use of antibiotics: Secondary | ICD-10-CM | POA: Insufficient documentation

## 2014-07-20 DIAGNOSIS — R1013 Epigastric pain: Secondary | ICD-10-CM | POA: Insufficient documentation

## 2014-07-20 DIAGNOSIS — Z8709 Personal history of other diseases of the respiratory system: Secondary | ICD-10-CM | POA: Insufficient documentation

## 2014-07-20 DIAGNOSIS — R52 Pain, unspecified: Secondary | ICD-10-CM

## 2014-07-20 LAB — HEPATIC FUNCTION PANEL
ALBUMIN: 4.1 g/dL (ref 3.5–5.2)
ALT: 9 U/L (ref 0–53)
AST: 22 U/L (ref 0–37)
Alkaline Phosphatase: 123 U/L — ABNORMAL HIGH (ref 39–117)
Total Bilirubin: 0.8 mg/dL (ref 0.3–1.2)
Total Protein: 7.6 g/dL (ref 6.0–8.3)

## 2014-07-20 LAB — BASIC METABOLIC PANEL
ANION GAP: 14 (ref 5–15)
BUN: 8 mg/dL (ref 6–23)
CHLORIDE: 103 meq/L (ref 96–112)
CO2: 26 mEq/L (ref 19–32)
CREATININE: 0.93 mg/dL (ref 0.50–1.35)
Calcium: 9.8 mg/dL (ref 8.4–10.5)
GFR calc non Af Amer: >90 mL/min (ref >90)
Glucose, Bld: 95 mg/dL (ref 70–99)
POTASSIUM: 4.1 meq/L (ref 3.7–5.3)
Sodium: 143 mEq/L (ref 137–147)

## 2014-07-20 LAB — CBC WITH DIFFERENTIAL/PLATELET
BASOS ABS: 0.1 10*3/uL (ref 0.0–0.1)
BASOS PCT: 1 % (ref 0–1)
Eosinophils Absolute: 0.2 10*3/uL (ref 0.0–0.7)
Eosinophils Relative: 3 % (ref 0–5)
HCT: 39 % (ref 39.0–52.0)
Hemoglobin: 13.7 g/dL (ref 13.0–17.0)
Lymphocytes Relative: 36 % (ref 12–46)
Lymphs Abs: 2.4 10*3/uL (ref 0.7–4.0)
MCH: 36.5 pg — ABNORMAL HIGH (ref 26.0–34.0)
MCHC: 35.1 g/dL (ref 30.0–36.0)
MCV: 104 fL — ABNORMAL HIGH (ref 78.0–100.0)
Monocytes Absolute: 0.5 10*3/uL (ref 0.1–1.0)
Monocytes Relative: 7 % (ref 3–12)
NEUTROS ABS: 3.5 10*3/uL (ref 1.7–7.7)
Neutrophils Relative %: 53 % (ref 43–77)
Platelets: 251 10*3/uL (ref 150–400)
RBC: 3.75 MIL/uL — ABNORMAL LOW (ref 4.22–5.81)
RDW: 13.2 % (ref 11.5–15.5)
WBC: 6.6 10*3/uL (ref 4.0–10.5)

## 2014-07-20 LAB — LIPASE, BLOOD: Lipase: 34 U/L (ref 11–59)

## 2014-07-20 MED ORDER — PANTOPRAZOLE SODIUM 40 MG IV SOLR
40.0000 mg | Freq: Once | INTRAVENOUS | Status: DC
  Filled 2014-07-20: qty 40

## 2014-07-20 MED ORDER — PANTOPRAZOLE SODIUM 40 MG PO TBEC
40.0000 mg | DELAYED_RELEASE_TABLET | Freq: Once | ORAL | Status: AC
  Administered 2014-07-20: 40 mg via ORAL
  Filled 2014-07-20: qty 1

## 2014-07-20 MED ORDER — RANITIDINE HCL 150 MG PO CAPS: 150.0000 mg | ORAL_CAPSULE | Freq: Two times a day (BID) | ORAL | Status: DC

## 2014-07-20 MED ORDER — TRAMADOL HCL 50 MG PO TABS: 50.0000 mg | ORAL_TABLET | Freq: Four times a day (QID) | ORAL | Status: DC | PRN

## 2014-07-20 MED ORDER — HYDROCODONE-ACETAMINOPHEN 5-325 MG PO TABS
1.0000 | ORAL_TABLET | Freq: Once | ORAL | Status: AC
  Administered 2014-07-20: 1 via ORAL
  Filled 2014-07-20: qty 1

## 2014-07-20 NOTE — ED Notes (Signed)
Patient states he is unable to give urine specimen at this time.

## 2014-07-20 NOTE — ED Provider Notes (Signed)
CSN: [id number]     Arrival date & time 07/20/14  1007 History   First MD Initiated Contact with Patient 07/20/14 1245     Chief Complaint  Patient presents with  . Abdominal Pain     (Consider location/radiation/quality/duration/timing/severity/associated sxs/prior Treatment) Patient is a 53 y.o. male presenting with abdominal pain. The history is provided by the patient (the pt complains of abd pain).  Abdominal Pain Pain location:  Epigastric Pain quality: aching   Pain radiates to:  Does not radiate Pain severity:  Moderate Onset quality:  Gradual Timing:  Intermittent Chronicity:  New Context: alcohol use   Associated symptoms: no chest pain, no cough, no diarrhea, no fatigue and no hematuria     Past Medical History  Diagnosis Date  . Bronchitis   . Chronic back pain    Past Surgical History  Procedure Laterality Date  . Hip surgery     Family History  Problem Relation Age of Onset  . Diabetes Mother   . Hyperlipidemia Mother   . Hypertension Mother   . Arthritis    . Asthma     History  Substance Use Topics  . Smoking status: Current Some Day Smoker -- 0.50 packs/day    Types: Cigarettes  . Smokeless tobacco: Never Used  . Alcohol Use: 1.8 oz/week    3 Shots of liquor per week     Comment: every day drinker    Review of Systems  Constitutional: Negative for appetite change and fatigue.  HENT: Negative for congestion, ear discharge and sinus pressure.   Eyes: Negative for discharge.  Respiratory: Negative for cough.   Cardiovascular: Negative for chest pain.  Gastrointestinal: Positive for abdominal pain. Negative for diarrhea.  Genitourinary: Negative for frequency and hematuria.  Musculoskeletal: Negative for back pain.  Skin: Negative for rash.  Neurological: Negative for seizures and headaches.  Psychiatric/Behavioral: Negative for hallucinations.      Allergies  Aspirin and Codeine  Home Medications   Prior to Admission medications    Medication Sig Start Date End Date Taking? Authorizing Provider  azithromycin (ZITHROMAX) 250 MG tablet Take 1 tablet (250 mg total) by mouth daily. Take first 2 tablets together, then 1 every day until finished. Patient not taking: Reported on 07/20/2014 01/14/14   Fredia Sorrow, MD  predniSONE (DELTASONE) 10 MG tablet Take 4 tablets (40 mg total) by mouth daily. Patient not taking: Reported on 07/20/2014 01/14/14   Fredia Sorrow, MD  ranitidine (ZANTAC) 150 MG capsule Take 1 capsule (150 mg total) by mouth 2 (two) times daily. 07/20/14   Maudry Diego, MD  traMADol (ULTRAM) 50 MG tablet Take 1 tablet (50 mg total) by mouth every 6 (six) hours as needed. 07/20/14   Maudry Diego, MD   BP 128/90 mmHg  Pulse 69  Temp(Src) 97.8 F (36.6 C) (Oral)  Resp 18  Ht 5\' 6"  (1.676 m)  Wt 155 lb (70.308 kg)  BMI 25.03 kg/m2  SpO2 97% Physical Exam  Constitutional: He is oriented to person, place, and time. He appears well-developed.  HENT:  Head: Normocephalic.  Eyes: Conjunctivae and EOM are normal. No scleral icterus.  Neck: Neck supple. No thyromegaly present.  Cardiovascular: Normal rate and regular rhythm.  Exam reveals no gallop and no friction rub.   No murmur heard. Pulmonary/Chest: No stridor. He has no wheezes. He has no rales. He exhibits no tenderness.  Abdominal: He exhibits no distension. There is tenderness. There is no rebound.  Tender epigastric  Musculoskeletal:  Normal range of motion. He exhibits no edema.  Lymphadenopathy:    He has no cervical adenopathy.  Neurological: He is oriented to person, place, and time. He exhibits normal muscle tone. Coordination normal.  Skin: No rash noted. No erythema.  Psychiatric: He has a normal mood and affect. His behavior is normal.    ED Course  Procedures (including critical care time) Labs Review Labs Reviewed  CBC WITH DIFFERENTIAL - Abnormal; Notable for the following:    RBC 3.75 (*)    MCV 104.0 (*)    MCH 36.5 (*)     All other components within normal limits  HEPATIC FUNCTION PANEL - Abnormal; Notable for the following:    Alkaline Phosphatase 123 (*)    All other components within normal limits  BASIC METABOLIC PANEL  LIPASE, BLOOD    Imaging Review Dg Abd Acute W/chest  07/20/2014   CLINICAL DATA:  Abdominal pain, generalized, with diarrhea and vomiting  EXAM: ACUTE ABDOMEN SERIES (ABDOMEN 2 VIEW & CHEST 1 VIEW)  COMPARISON:  Chest radiograph January 14, 2014; CT abdomen and pelvis March 19, 2013  FINDINGS: PA chest: No edema or consolidation. Heart size and pulmonary vascularity are normal. No adenopathy.  Supine and upright abdomen: There is moderate diffuse stool throughout the colon. There is no bowel dilatation or air-fluid level suggesting obstruction. No free air. There are phleboliths in pelvis. There is postoperative change in the left acetabulum and proximal femur regions.  IMPRESSION: Moderate stool in colon. Overall bowel gas pattern unremarkable. No lung edema or consolidation.   Electronically Signed   By: Lowella Grip M.D.   On: 07/20/2014 14:04     EKG Interpretation None      MDM   Final diagnoses:  Pain  Abdominal pain in male       Maudry Diego, MD 07/20/14 [phone]

## 2014-07-20 NOTE — ED Notes (Signed)
Pt called for room. No answer 

## 2014-07-20 NOTE — ED Notes (Signed)
Pt reports abdominal pain with diarrhea and vomiting.

## 2014-07-20 NOTE — ED Notes (Signed)
Patient with no complaints at this time. Respirations even and unlabored. Skin warm/dry. Discharge instructions reviewed with patient at this time. Patient given opportunity to voice concerns/ask questions. Patient discharged at this time and left Emergency Department with steady gait.   

## 2014-07-20 NOTE — Discharge Instructions (Signed)
Follow up with dr. Oneida Alar or a family md in one week.

## 2014-07-20 NOTE — ED Notes (Signed)
Patient states he doesn't want an IV. MD made aware and Verbal order for 40 mg Protonix PO and 1 Vicodin 5-325 mg PO obtained.

## 2014-08-08 ENCOUNTER — Encounter (HOSPITAL_COMMUNITY): Payer: Self-pay | Admitting: Emergency Medicine

## 2014-08-08 ENCOUNTER — Emergency Department (HOSPITAL_COMMUNITY)
Admission: EM | Admit: 2014-08-08 | Discharge: 2014-08-08 | Disposition: A | Discharge status: Home / Self Care | Payer: Self-pay | Attending: Emergency Medicine | Admitting: Emergency Medicine

## 2014-08-08 ENCOUNTER — Emergency Department (HOSPITAL_COMMUNITY): Payer: Self-pay

## 2014-08-08 DIAGNOSIS — Y9289 Other specified places as the place of occurrence of the external cause: Secondary | ICD-10-CM | POA: Insufficient documentation

## 2014-08-08 DIAGNOSIS — Z8709 Personal history of other diseases of the respiratory system: Secondary | ICD-10-CM | POA: Insufficient documentation

## 2014-08-08 DIAGNOSIS — W19XXXA Unspecified fall, initial encounter: Secondary | ICD-10-CM

## 2014-08-08 DIAGNOSIS — W108XXA Fall (on) (from) other stairs and steps, initial encounter: Secondary | ICD-10-CM | POA: Insufficient documentation

## 2014-08-08 DIAGNOSIS — Z720 Tobacco use: Secondary | ICD-10-CM | POA: Insufficient documentation

## 2014-08-08 DIAGNOSIS — G8929 Other chronic pain: Secondary | ICD-10-CM | POA: Insufficient documentation

## 2014-08-08 DIAGNOSIS — S79912A Unspecified injury of left hip, initial encounter: Secondary | ICD-10-CM | POA: Insufficient documentation

## 2014-08-08 DIAGNOSIS — Y998 Other external cause status: Secondary | ICD-10-CM | POA: Insufficient documentation

## 2014-08-08 DIAGNOSIS — Z79899 Other long term (current) drug therapy: Secondary | ICD-10-CM | POA: Insufficient documentation

## 2014-08-08 DIAGNOSIS — Y9389 Activity, other specified: Secondary | ICD-10-CM | POA: Insufficient documentation

## 2014-08-08 DIAGNOSIS — S3992XA Unspecified injury of lower back, initial encounter: Secondary | ICD-10-CM | POA: Insufficient documentation

## 2014-08-08 MED ORDER — HYDROCODONE-ACETAMINOPHEN 5-325 MG PO TABS
1.0000 | ORAL_TABLET | Freq: Once | ORAL | Status: AC
  Administered 2014-08-08: 1 via ORAL

## 2014-08-08 MED ORDER — ACETAMINOPHEN 500 MG PO TABS
1000.0000 mg | ORAL_TABLET | Freq: Once | ORAL | Status: AC
  Administered 2014-08-08: 1000 mg via ORAL
  Filled 2014-08-08: qty 2

## 2014-08-08 MED ORDER — HYDROCODONE-ACETAMINOPHEN 5-325 MG PO TABS: 1.0000 | ORAL_TABLET | Freq: Four times a day (QID) | ORAL | Status: DC | PRN

## 2014-08-08 MED ORDER — HYDROCODONE-ACETAMINOPHEN 5-325 MG PO TABS
ORAL_TABLET | ORAL | Status: AC
  Administered 2014-08-08: 1 via ORAL
  Filled 2014-08-08: qty 1

## 2014-08-08 MED ORDER — HYDROCODONE-ACETAMINOPHEN 5-325 MG PO TABS: 1.0000 | ORAL_TABLET | ORAL | Status: DC | PRN

## 2014-08-08 NOTE — ED Notes (Signed)
Patient c/o back pain and left hip pain. Per patient fell New Years Eve, slipped on steps. Denies hitting head. Patient reports chronic hx of back pain and hip pain. Patient ambulated to triage. No shortening or rotation noted. Patient also wants to have burn to left hand, pinky, and thumb evaluated. Per patient burned hand with grease from [place] while trying to remove it from oven. Per patient happened on Christmas Eve. Denies any signs of infection.

## 2014-08-08 NOTE — Discharge Instructions (Signed)
Fall Prevention and Home Safety Take Tylenol for mild pain or the pain medicine prescribed for bad pain. Call any of the numbers on the resource guide to get a primary care physician. Falls cause injuries and can affect all age groups. It is possible to use preventive measures to significantly decrease the likelihood of falls. There are many simple measures which can make your home safer and prevent falls. OUTDOORS  Repair cracks and edges of walkways and driveways.  Remove high doorway thresholds.  Trim shrubbery on the main path into your home.  Have good outside lighting.  Clear walkways of tools, rocks, debris, and clutter.  Check that handrails are not broken and are securely fastened. Both sides of steps should have handrails.  Have leaves, snow, and ice cleared regularly.  Use sand or salt on walkways during winter months.  In the garage, clean up grease or oil spills. BATHROOM  Install night lights.  Install grab bars by the toilet and in the tub and shower.  Use non-skid mats or decals in the tub or shower.  Place a plastic non-slip stool in the shower to sit on, if needed.  Keep floors dry and clean up all water on the floor immediately.  Remove soap buildup in the tub or shower on a regular basis.  Secure bath mats with non-slip, double-sided rug tape.  Remove throw rugs and tripping hazards from the floors. BEDROOMS  Install night lights.  Make sure a bedside light is easy to reach.  Do not use oversized bedding.  Keep a telephone by your bedside.  Have a firm chair with side arms to use for getting dressed.  Remove throw rugs and tripping hazards from the floor. KITCHEN  Keep handles on pots and pans turned toward the center of the stove. Use back burners when possible.  Clean up spills quickly and allow time for drying.  Avoid walking on wet floors.  Avoid hot utensils and knives.  Position shelves so they are not too high or low.  Place  commonly used objects within easy reach.  If necessary, use a sturdy step stool with a grab bar when reaching.  Keep electrical cables out of the way.  Do not use floor polish or wax that makes floors slippery. If you must use wax, use non-skid floor wax.  Remove throw rugs and tripping hazards from the floor. STAIRWAYS  Never leave objects on stairs.  Place handrails on both sides of stairways and use them. Fix any loose handrails. Make sure handrails on both sides of the stairways are as long as the stairs.  Check carpeting to make sure it is firmly attached along stairs. Make repairs to worn or loose carpet promptly.  Avoid placing throw rugs at the top or bottom of stairways, or properly secure the rug with carpet tape to prevent slippage. Get rid of throw rugs, if possible.  Have an electrician put in a light switch at the top and bottom of the stairs. OTHER FALL PREVENTION TIPS  Wear low-heel or rubber-soled shoes that are supportive and fit well. Wear closed toe shoes.  When using a stepladder, make sure it is fully opened and both spreaders are firmly locked. Do not climb a closed stepladder.  Add color or contrast paint or tape to grab bars and handrails in your home. Place contrasting color strips on first and last steps.  Learn and use mobility aids as needed. Install an electrical emergency response system.  Turn on lights to  avoid dark areas. Replace light bulbs that burn out immediately. Get light switches that glow.  Arrange furniture to create clear pathways. Keep furniture in the same place.  Firmly attach carpet with non-skid or double-sided tape.  Eliminate uneven floor surfaces.  Select a carpet pattern that does not visually hide the edge of steps.  Be aware of all pets. OTHER HOME SAFETY TIPS  Set the water temperature for 120 F (48.8 C).  Keep emergency numbers on or near the telephone.  Keep smoke detectors on every level of the home and near  sleeping areas. Document Released: 07/14/2002 Document Revised: 01/23/2012 Document Reviewed: 10/13/2011 [facility] Patient Information 2015 [place], [place]. This information is not intended to replace advice given to you by your health care provider. Make sure you discuss any questions you have with your health care provider.  Emergency Department Resource Guide 1) Find a Doctor and Pay Out of Pocket Although you won't have to find out who is covered by your insurance plan, it is a good idea to ask around and get recommendations. You will then need to call the office and see if the doctor you have chosen will accept you as a new patient and what types of options they offer for patients who are self-pay. Some doctors offer discounts or will set up payment plans for their patients who do not have insurance, but you will need to ask so you aren't surprised when you get to your appointment.  2) Contact Your Local Health Department Not all health departments have doctors that can see patients for sick visits, but many do, so it is worth a call to see if yours does. If you don't know where your local health department is, you can check in your phone book. The CDC also has a tool to help you locate your state's health department, and many state websites also have listings of all of their local health departments.  3) Find a [facility] If your illness is not likely to be very severe or complicated, you may want to try a walk in clinic. These are popping up all over the country in pharmacies, drugstores, and shopping centers. They're usually staffed by nurse practitioners or physician assistants that have been trained to treat common illnesses and complaints. They're usually fairly quick and inexpensive. However, if you have serious medical issues or chronic medical problems, these are probably not your best option.  No Primary Care Doctor: - Call Health Connect at  [phone] - they can help you locate a  primary care doctor that  accepts your insurance, provides certain services, etc. - Physician Referral Service- [phone]  Chronic Pain Problems: Organization         Address  Phone   Notes  [facility]  [phone] Patients need to be referred by their primary care doctor.   Medication Assistance: Organization         Address  Phone   Notes  [facility] Medication [facility] [place] [place], [place] [address] [phone] --Must be a resident of [facility] -- Must have NO insurance coverage whatsoever (no Medicaid/ Medicare, etc.) -- The pt. MUST have a primary care doctor that directs their care regularly and follows them in the community   MedAssist  [phone]   [employer]  [phone]    Agencies that provide inexpensive medical care: Patent attorney  Notes  [employer]  [phone]   [facility]    [phone]   [facility] [place], [place] [address] [phone]   [place] [address], [place] [phone]   Planned Parenthood    [phone]   [facility]    [phone]   [place] [address], [place] Phone:  [phone], Fax:  [phone] Hours of Operation:  9 am - 6 pm, M-F.  Also accepts Medicaid/Medicare and self-pay.  [facility] for [address], [address], [place] Phone: [phone], Fax: [phone]. Hours of Operation:  8:30 am - 5:30 pm, M-F.  Also accepts Medicaid and self-pay.  [facility] [place] [address] [place] Phone: [phone]   [place], [place], [place] [phone], Ext. [phone] Mondays & Thursdays: 7-9 AM.  First 15 patients are seen on a first come, first serve basis.    [place]  Providers:  Organization         Address  Phone   Notes  [facility] [address],  [phone] Also accepts self-pay patients.  [facility] [address], [place]  [phone]   [place], [address], [place] [phone]   [facility] [address], [place] [phone]   Lucianne Lei [address], [place]   [phone] Only accepts [place] Access [place] patients after they have their name applied to their card.   Self-Pay (no insurance) in [facility]:  Organization         Address  Phone   Notes  Sickle Cell Patients, [facility] [place] [phone]   [facility] [place] [phone]   [facility] [place]  [place], [place], [place] [phone]   [facility]/Dr. Osei-Bonsu  [address], [place] or [address], [place] [phone] Phone number for both [place] and [place] locations is the same.  [facility] [address], [place] [phone]   [facility] [address] [place] or [address] [phone]   [facility] [address], [place] [phone], phone; [phone], fax Sees patients 1st and 3rd Saturday of every month.  Must not qualify for public or private insurance (i.e. Medicaid, Medicare, [place] Health Choice, Veterans' Benefits)  Household income should be no more than 200% of the poverty level The clinic cannot treat you if you are pregnant or think you are pregnant  Sexually transmitted diseases are not treated at the clinic.    Dental Care: Organization         Address  Phone  Notes  [facility] Department of [facility] [place] [phone] Accepts children up to age 22 who are enrolled in [place] or [place]; pregnant women with a Medicaid card; and children who have applied for Medicaid or [place] Health Choice, but were declined, whose parents can pay a reduced fee at time of service.  [facility]  Department of [facility]  [address], [place] [phone] Accepts children up to age 54 who are enrolled in [place] or [place]; pregnant women with a Medicaid card; and children who have applied for Medicaid or [place] Health Choice, but were declined, whose parents can pay a reduced fee at time of service.  [facility] Adult [facility] Access PRO[facility]  [place] [phone] Patients are seen by appointment only. Walk-ins are not accepted. [place] will see patients 64 years of age and older. Monday - Tuesday (8am-5pm) Most Wednesdays (8:30-5pm) $30 per visit, cash only  [facility] Adult Dental Access PROGRAM  [address], [place] [facility] [place] [phone] Patients are seen by appointment only. Walk-ins are not accepted. [place] will see patients 44 years of age and older. One Wednesday Evening (Monthly: Volunteer Based).  $30 per visit, cash only  [place]  [phone] for adults; Children under age 33, call Graduate Pediatric Dentistry at [phone]. Children aged 53-14, please call [phone] to request a pediatric application.  Dental services are provided in all areas of dental care including fillings, crowns and bridges, complete and partial dentures, implants, gum treatment, root canals, and extractions. Preventive care is also provided. Treatment is provided to both adults and children. Patients are selected via a lottery and there is often a waiting list.   [facility] [address], [place]  [phone] [email or web address]   [facility]  [address] [place], [place] [phone], Ext. [phone] Second and Fourth Thursday of each month, opens at 6:30 AM; Clinic ends at 9 AM.  Patients are seen on a first-come first-served basis, and a limited number are seen during each clinic.   [facility]  [address] [place] Danker [place], [place] [phone]   Eligibility Requirements You must have lived in [place], [place], or [place] counties for at least the last three months.   You cannot be eligible for state or federal sponsored [employer], including [employer], [place], or [employer].   You generally cannot be eligible for healthcare insurance through your employer.    How to apply: Eligibility screenings are held every Tuesday and Wednesday afternoon from 1:00 pm until 4:00 pm. You do not need an appointment for the interview!  [facility] [address] [place], [place]   [place]  [place] Department  [place]  [phone]    Behavioral Health Resources in the Community: Intensive Outpatient Programs Organization         Address  Phone  Notes  [place]. [address], [place], [place] [phone]   [facility] [address], [place], [place]   ADS: [facility] [address] [place], [place]   [place] [address],  [place], [place] or [phone]   Substance Abuse Resources Organization         Address  Phone  Notes  Alcohol and Drug Services  [phone]   [place]  [phone]   [facility] [place]   Chinita Pester  [phone]   Residential & Outpatient Substance Abuse Program  [phone]   Psychological Services Organization         Address  Phone  Notes  [place]  Health  [place]   [place] [address], [place] or [phone]    Mobile Crisis Teams Organization         Address  Phone  Notes  [facility], Mobile Crisis Care Unit  [phone]   [facility]  [address] [place], [place]   [place] [address], [place] [phone]    Self-Help/Support Groups Organization         Address  Phone             Notes  [place]. of [place] - variety of support groups  [place] Call for more information  Narcotics Anonymous (NA), [facility] [address], [employer] [place]  2 meetings at this location   Special educational needs teacher         Address  Phone  Notes  [facility] Residential Treatment [place],    [place]  [phone]   [facility]  [address] [place] [address], [place], [place]   [place], [place] [phone] Admissions: 8am-3pm M-F  Incentives Substance [place] [address]    [place], [place] [phone]   [facility] [address] [place], [place], [place]   [facility] [address]  [place], [place]   [facility] - Intensive Outpatient [address]een [address], [place], [place]   [facility] ([place].,  [place], [place] [phone] or [phone]   [facility] Services (RTS) [address], [place], [place] Accepts Medicaid  [facility] [address],  [place] [phone] Substance Abuse/Addiction Treatment   [facility] Organization         Address  Phone  Notes  [facility]  [phone]   Domenic Schwab, PhD [address] Arlis Port[place], [place]   [phone] or [phone]    [place], [place] [phone]   [facility] [address], [place], [place] [phone] Insurance/Medicaid/sponsorship through [facility] and Families [address]                                    [place], [place] [phone] [place] [address], [place] [phone]    Dr. Adele Schilder  [phone]   [facility] Dept. 1) [address] [place] 2) [place] 3)  [address], [place] [phone]  [phone]   [place] [phone] or [phone] (After Hours)
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
## 2015-06-09 ENCOUNTER — Emergency Department (HOSPITAL_COMMUNITY): Payer: Self-pay

## 2015-06-09 ENCOUNTER — Emergency Department (HOSPITAL_COMMUNITY)
Admission: EM | Admit: 2015-06-09 | Discharge: 2015-06-09 | Disposition: A | Discharge status: Home / Self Care | Payer: Self-pay | Attending: Emergency Medicine | Admitting: Emergency Medicine

## 2015-06-09 ENCOUNTER — Encounter (HOSPITAL_COMMUNITY): Payer: Self-pay | Admitting: *Deleted

## 2015-06-09 DIAGNOSIS — Z8709 Personal history of other diseases of the respiratory system: Secondary | ICD-10-CM | POA: Insufficient documentation

## 2015-06-09 DIAGNOSIS — M25552 Pain in left hip: Secondary | ICD-10-CM | POA: Insufficient documentation

## 2015-06-09 DIAGNOSIS — G8929 Other chronic pain: Secondary | ICD-10-CM | POA: Insufficient documentation

## 2015-06-09 DIAGNOSIS — M545 Low back pain: Secondary | ICD-10-CM | POA: Insufficient documentation

## 2015-06-09 DIAGNOSIS — M159 Polyosteoarthritis, unspecified: Secondary | ICD-10-CM

## 2015-06-09 DIAGNOSIS — Z720 Tobacco use: Secondary | ICD-10-CM | POA: Insufficient documentation

## 2015-06-09 DIAGNOSIS — M1990 Unspecified osteoarthritis, unspecified site: Secondary | ICD-10-CM | POA: Insufficient documentation

## 2015-06-09 MED ORDER — CYCLOBENZAPRINE HCL 5 MG PO TABS: 5.0000 mg | ORAL_TABLET | Freq: Two times a day (BID) | ORAL | Status: AC | PRN

## 2015-06-09 MED ORDER — TRAMADOL HCL 50 MG PO TABS: 50.0000 mg | ORAL_TABLET | Freq: Four times a day (QID) | ORAL | Status: AC | PRN

## 2015-06-09 MED ORDER — TRAMADOL HCL 50 MG PO TABS
50.0000 mg | ORAL_TABLET | Freq: Once | ORAL | Status: AC
  Administered 2015-06-09: 50 mg via ORAL
  Filled 2015-06-09: qty 1

## 2015-06-09 MED ORDER — CYCLOBENZAPRINE HCL 10 MG PO TABS
5.0000 mg | ORAL_TABLET | Freq: Once | ORAL | Status: AC
  Administered 2015-06-09: 5 mg via ORAL
  Filled 2015-06-09: qty 1

## 2015-06-09 NOTE — ED Notes (Signed)
Pt made aware to return if symptoms worsen or if any life threatening symptoms occur.   

## 2015-06-09 NOTE — ED Notes (Signed)
Patient c/o left lower back pain that radiates to left hip. Reports a lot of strenuous activity the past few days, including pushing a car this morning.

## 2015-06-09 NOTE — Discharge Instructions (Signed)

## 2015-06-09 NOTE — ED Provider Notes (Signed)
CSN: [id number]     Arrival date & time 06/09/15  1058 History   First MD Initiated Contact with Patient 06/09/15 1104     Chief Complaint  Patient presents with  . Back Pain     (Consider location/radiation/quality/duration/timing/severity/associated sxs/prior Treatment) HPI Comments: Pt comes in with c/o lower back pain radiating down his left leg. He has been doing a lot of lifting and pushing over the last week. No fall. No numbness, weakness or incontinence. He states that he hasn't taken anything for the symptoms. He has had hip surgery in the past.   The history is provided by the patient. No language interpreter was used.    Past Medical History  Diagnosis Date  . Bronchitis   . Chronic back pain    Past Surgical History  Procedure Laterality Date  . Hip surgery     Family History  Problem Relation Age of Onset  . Diabetes Mother   . Hyperlipidemia Mother   . Hypertension Mother   . Arthritis    . Asthma     Social History  Substance Use Topics  . Smoking status: Current Some Day Smoker -- 0.50 packs/day for 30 years    Types: Cigarettes  . Smokeless tobacco: Never Used  . Alcohol Use: 1.8 oz/week    3 Shots of liquor per week     Comment: every day drinker    Review of Systems  All other systems reviewed and are negative.     Allergies  Aspirin  Home Medications   Prior to Admission medications   Medication Sig Start Date End Date Taking? Authorizing Provider  azithromycin (ZITHROMAX) 250 MG tablet Take 1 tablet (250 mg total) by mouth daily. Take first 2 tablets together, then 1 every day until finished. Patient not taking: Reported on 07/20/2014 01/14/14   Fredia Sorrow, MD  HYDROcodone-acetaminophen (NORCO) 5-325 MG per tablet Take 1-2 tablets by mouth every 4 (four) hours as needed. 08/08/14   Orlie Dakin, MD  HYDROcodone-acetaminophen (NORCO/VICODIN) 5-325 MG per tablet Take 1-2 tablets by mouth every 6 (six) hours as needed for moderate pain  or severe pain. 08/08/14   Orlie Dakin, MD  predniSONE (DELTASONE) 10 MG tablet Take 4 tablets (40 mg total) by mouth daily. Patient not taking: Reported on 07/20/2014 01/14/14   Fredia Sorrow, MD  ranitidine (ZANTAC) 150 MG capsule Take 1 capsule (150 mg total) by mouth 2 (two) times daily. Patient not taking: Reported on 08/08/2014 07/20/14   Milton Ferguson, MD  traMADol (ULTRAM) 50 MG tablet Take 1 tablet (50 mg total) by mouth every 6 (six) hours as needed. Patient not taking: Reported on 08/08/2014 07/20/14   Milton Ferguson, MD   BP 130/89 mmHg  Pulse 75  Temp(Src) 97.6 F (36.4 C) (Oral)  Resp 18  Ht 5\' 6"  (1.676 m)  Wt 150 lb (68.04 kg)  BMI 24.22 kg/m2  SpO2 100% Physical Exam  Constitutional: He is oriented to person, place, and time. He appears well-developed and well-nourished.  Cardiovascular: Normal rate and regular rhythm.   Pulmonary/Chest: Effort normal and breath sounds normal.  Musculoskeletal:  Lumbar spine and paraspinal tenderness. Left sciatic notch tenderness  Neurological: He is alert and oriented to person, place, and time. He exhibits normal muscle tone. Coordination normal.  Skin: Skin is warm and dry.  Psychiatric: He has a normal mood and affect.  Nursing note and vitals reviewed.   ED Course  Procedures (including critical care time) Labs Review Labs Reviewed -  No data to display  Imaging Review Dg Lumbar Spine Complete  06/09/2015  CLINICAL DATA:  Chronic lumbago with left-sided radicular symptoms, exacerbated after moving furniture EXAM: [place] 4+ VIEW COMPARISON:  February 03, 2015 FINDINGS: Frontal, lateral, spot lumbosacral lateral, and bilateral oblique views were obtained. There is no fracture or spondylolisthesis. There is stable levoscoliosis. There is no fracture or spondylolisthesis. Marked disc space narrowing at L5-S1 is again noted with anterior and posterior osteophytes at L5. Other disc spaces appear intact. There is facet  osteoarthritic change at L5-S1 bilaterally. There is postoperative change in the left acetabular region and proximal left femur, stable. IMPRESSION: Marked osteoarthritic change at L5-S1, stable. No progression of arthropathy is seen compared to prior study. There is stable levoscoliosis. No fracture or spondylolisthesis. Postoperative change in the left acetabulum and proximal left femur regions again noted. Electronically Signed   By: Lowella Grip III M.D.   On: 06/09/2015 11:52   I have personally reviewed and evaluated these images and lab results as part of my medical decision-making.   EKG Interpretation None      MDM   Final diagnoses:  Osteoarthritis of multiple joints, unspecified osteoarthritis type    No acute bony injury. Pt has no red flag symptoms. Will send home with flexeril and ultram. Discussed follow up for continued symptoms    Glendell Docker, NP 06/09/15 [place], DO 06/10/15 [phone]

## 2016-06-05 DIAGNOSIS — S8011XA Contusion of right lower leg, initial encounter: Secondary | ICD-10-CM | POA: Diagnosis not present

## 2016-06-05 DIAGNOSIS — W1839XA Other fall on same level, initial encounter: Secondary | ICD-10-CM | POA: Diagnosis not present

## 2016-06-05 DIAGNOSIS — J449 Chronic obstructive pulmonary disease, unspecified: Secondary | ICD-10-CM | POA: Diagnosis not present

## 2016-06-05 DIAGNOSIS — Z836 Family history of other diseases of the respiratory system: Secondary | ICD-10-CM | POA: Diagnosis not present

## 2016-08-11 DIAGNOSIS — J449 Chronic obstructive pulmonary disease, unspecified: Secondary | ICD-10-CM | POA: Diagnosis not present

## 2016-08-11 DIAGNOSIS — W06XXXA Fall from bed, initial encounter: Secondary | ICD-10-CM | POA: Diagnosis not present

## 2016-08-11 DIAGNOSIS — M545 Low back pain: Secondary | ICD-10-CM | POA: Diagnosis not present

## 2016-08-11 DIAGNOSIS — F17200 Nicotine dependence, unspecified, uncomplicated: Secondary | ICD-10-CM | POA: Diagnosis not present

## 2016-08-11 DIAGNOSIS — M25552 Pain in left hip: Secondary | ICD-10-CM | POA: Diagnosis not present

## 2017-03-07 DIAGNOSIS — Z825 Family history of asthma and other chronic lower respiratory diseases: Secondary | ICD-10-CM | POA: Diagnosis not present

## 2017-03-07 DIAGNOSIS — R0789 Other chest pain: Secondary | ICD-10-CM | POA: Diagnosis not present

## 2017-03-07 DIAGNOSIS — J449 Chronic obstructive pulmonary disease, unspecified: Secondary | ICD-10-CM | POA: Diagnosis not present

## 2017-03-07 DIAGNOSIS — F17210 Nicotine dependence, cigarettes, uncomplicated: Secondary | ICD-10-CM | POA: Diagnosis not present

## 2017-03-07 DIAGNOSIS — M5412 Radiculopathy, cervical region: Secondary | ICD-10-CM | POA: Diagnosis not present

## 2017-03-07 DIAGNOSIS — Z833 Family history of diabetes mellitus: Secondary | ICD-10-CM | POA: Diagnosis not present

## 2017-03-07 DIAGNOSIS — R079 Chest pain, unspecified: Secondary | ICD-10-CM | POA: Diagnosis not present

## 2017-03-07 DIAGNOSIS — Z8249 Family history of ischemic heart disease and other diseases of the circulatory system: Secondary | ICD-10-CM | POA: Diagnosis not present

## 2017-03-08 DIAGNOSIS — R072 Precordial pain: Secondary | ICD-10-CM | POA: Diagnosis not present

## 2017-03-08 DIAGNOSIS — I10 Essential (primary) hypertension: Secondary | ICD-10-CM | POA: Diagnosis present

## 2017-03-08 DIAGNOSIS — R079 Chest pain, unspecified: Secondary | ICD-10-CM | POA: Diagnosis not present

## 2017-03-08 DIAGNOSIS — R001 Bradycardia, unspecified: Secondary | ICD-10-CM | POA: Diagnosis not present

## 2017-03-08 DIAGNOSIS — J449 Chronic obstructive pulmonary disease, unspecified: Secondary | ICD-10-CM | POA: Diagnosis present

## 2017-03-08 DIAGNOSIS — R0789 Other chest pain: Secondary | ICD-10-CM | POA: Diagnosis not present

## 2017-03-08 DIAGNOSIS — F17200 Nicotine dependence, unspecified, uncomplicated: Secondary | ICD-10-CM | POA: Diagnosis not present

## 2017-03-08 DIAGNOSIS — F17210 Nicotine dependence, cigarettes, uncomplicated: Secondary | ICD-10-CM | POA: Diagnosis present

## 2017-03-08 DIAGNOSIS — E039 Hypothyroidism, unspecified: Secondary | ICD-10-CM | POA: Diagnosis present

## 2017-03-08 DIAGNOSIS — Z96642 Presence of left artificial hip joint: Secondary | ICD-10-CM | POA: Diagnosis present

## 2017-03-08 DIAGNOSIS — Z833 Family history of diabetes mellitus: Secondary | ICD-10-CM | POA: Diagnosis not present

## 2017-03-08 DIAGNOSIS — I214 Non-ST elevation (NSTEMI) myocardial infarction: Secondary | ICD-10-CM | POA: Diagnosis present

## 2017-03-08 DIAGNOSIS — R0602 Shortness of breath: Secondary | ICD-10-CM | POA: Diagnosis not present

## 2017-03-08 DIAGNOSIS — I517 Cardiomegaly: Secondary | ICD-10-CM | POA: Diagnosis not present

## 2017-03-08 DIAGNOSIS — I2510 Atherosclerotic heart disease of native coronary artery without angina pectoris: Secondary | ICD-10-CM | POA: Diagnosis present

## 2017-03-08 DIAGNOSIS — Z8249 Family history of ischemic heart disease and other diseases of the circulatory system: Secondary | ICD-10-CM | POA: Diagnosis not present

## 2017-03-08 DIAGNOSIS — Z825 Family history of asthma and other chronic lower respiratory diseases: Secondary | ICD-10-CM | POA: Diagnosis not present

## 2017-03-08 DIAGNOSIS — R778 Other specified abnormalities of plasma proteins: Secondary | ICD-10-CM | POA: Diagnosis not present

## 2017-03-08 DIAGNOSIS — I509 Heart failure, unspecified: Secondary | ICD-10-CM | POA: Diagnosis not present

## 2017-03-08 DIAGNOSIS — Z720 Tobacco use: Secondary | ICD-10-CM | POA: Diagnosis not present

## 2017-03-08 DIAGNOSIS — R9431 Abnormal electrocardiogram [ECG] [EKG]: Secondary | ICD-10-CM | POA: Diagnosis not present

## 2017-03-29 DIAGNOSIS — I2510 Atherosclerotic heart disease of native coronary artery without angina pectoris: Secondary | ICD-10-CM | POA: Diagnosis not present

## 2017-03-29 DIAGNOSIS — R0602 Shortness of breath: Secondary | ICD-10-CM | POA: Diagnosis not present

## 2017-05-11 DIAGNOSIS — M545 Low back pain: Secondary | ICD-10-CM | POA: Diagnosis not present

## 2017-06-30 IMAGING — DX DG LUMBAR SPINE COMPLETE 4+V
5 series · 5 of 5 positions shown · non-contrast
Comparison: February 03, 2015

CLINICAL DATA: Chronic lumbago with left-sided radicular symptoms,
exacerbated after moving furniture

EXAM:
LUMBAR SPINE - COMPLETE 4+ VIEW

[l-spine ap]
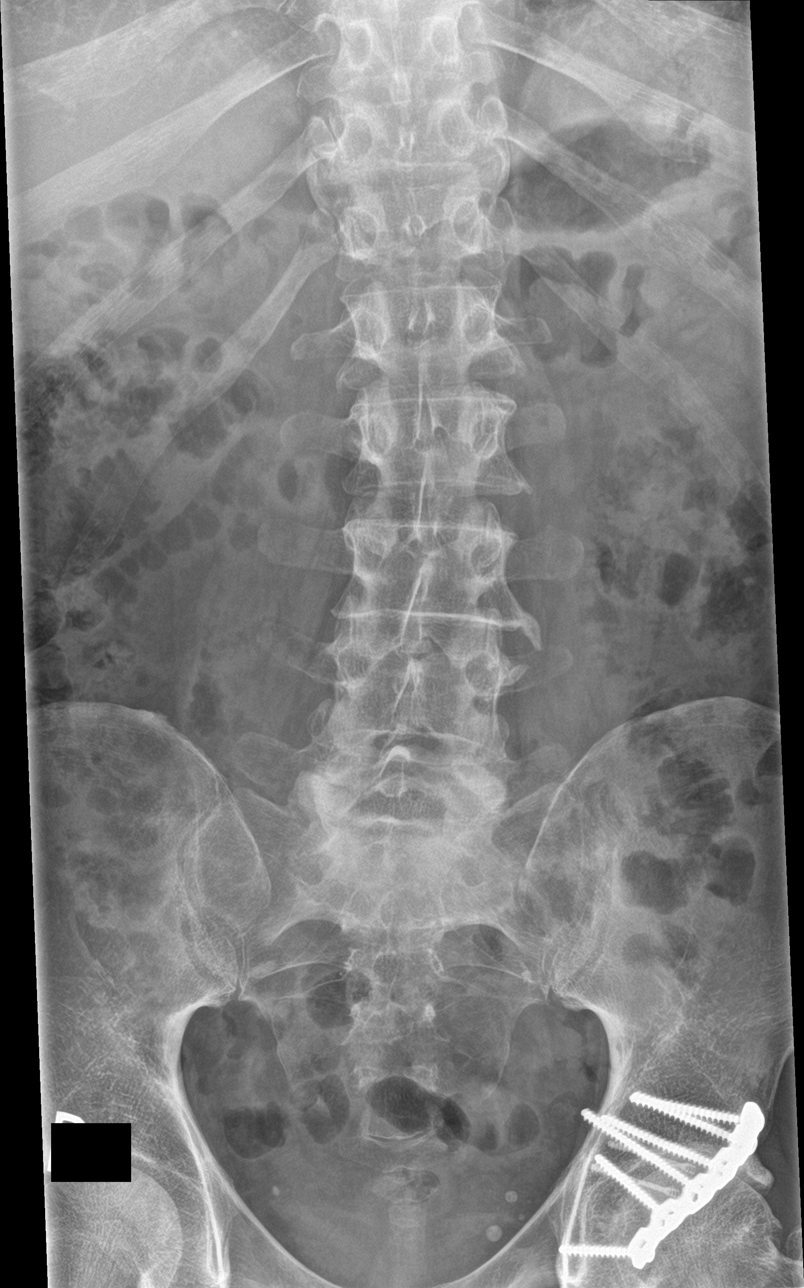

[l-spine obl (1 of 2)]
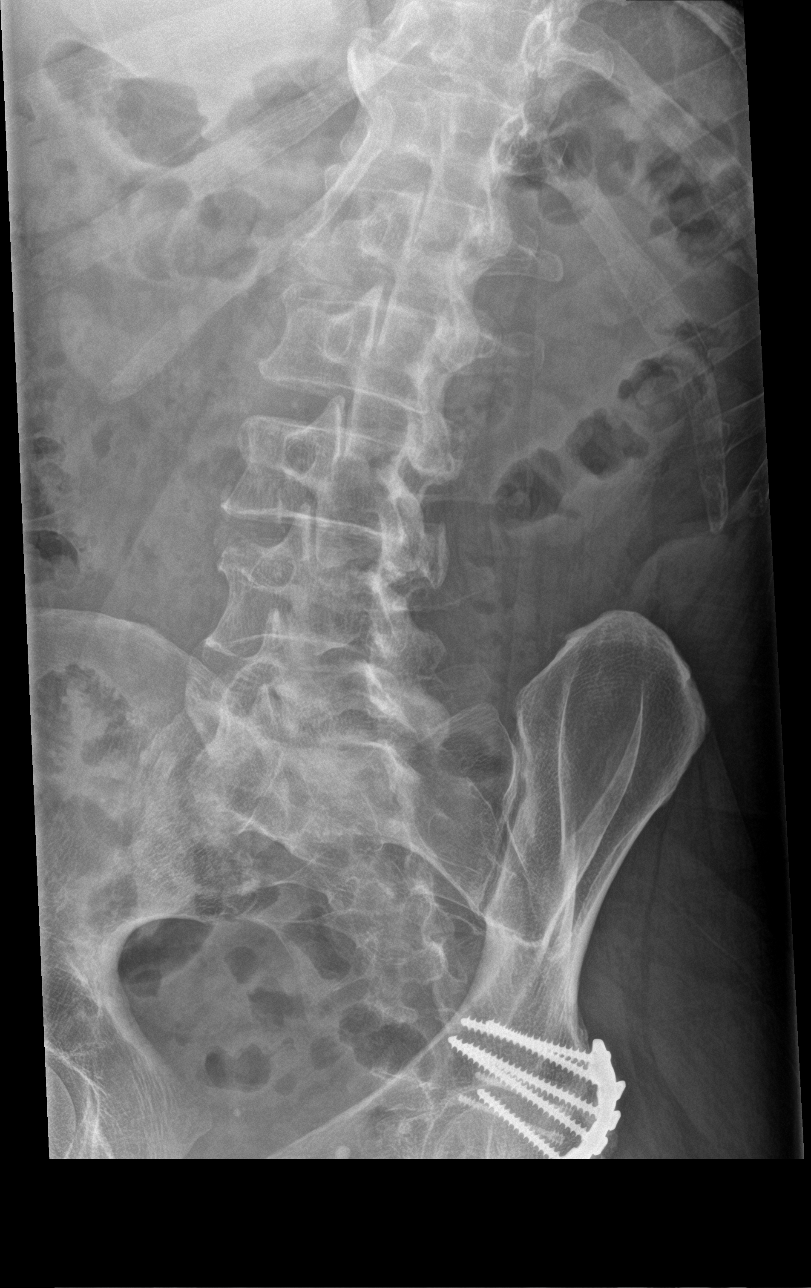

[l-spine obl (2 of 2)]
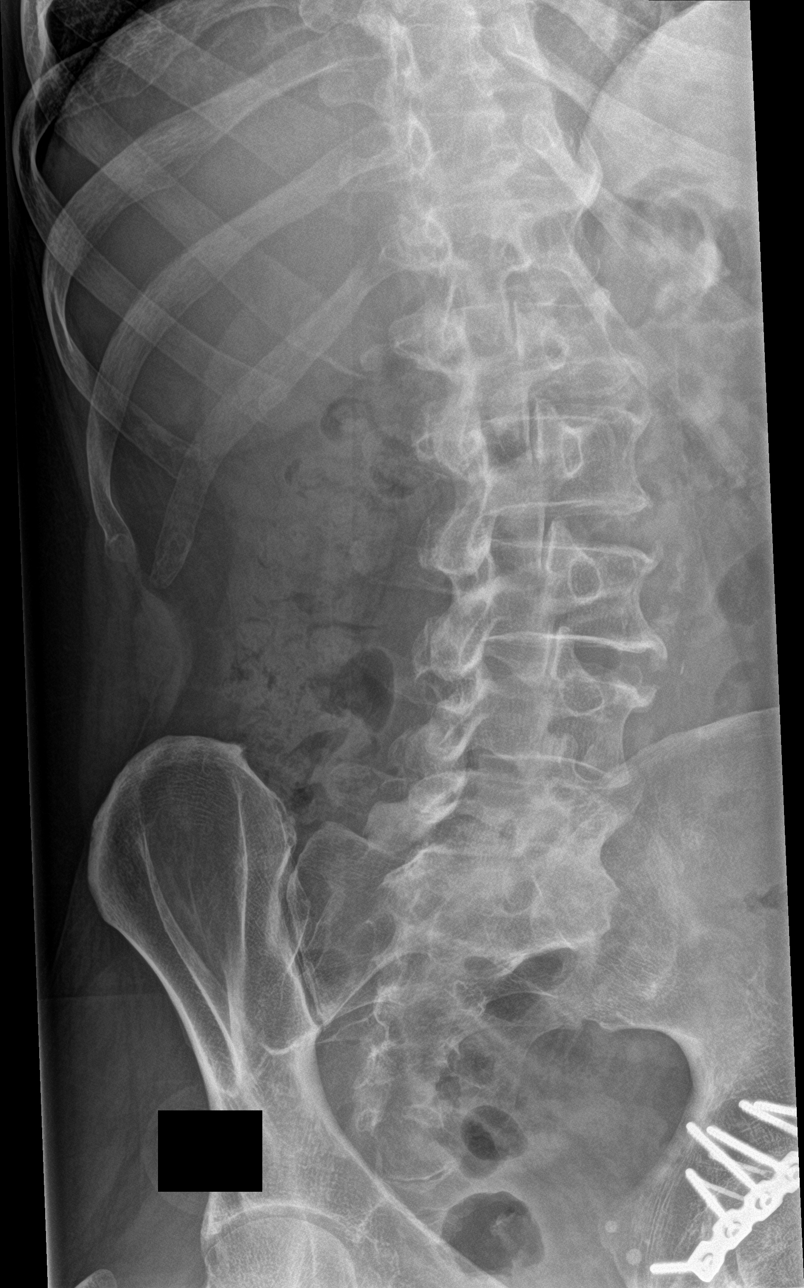

[l-spine lat]
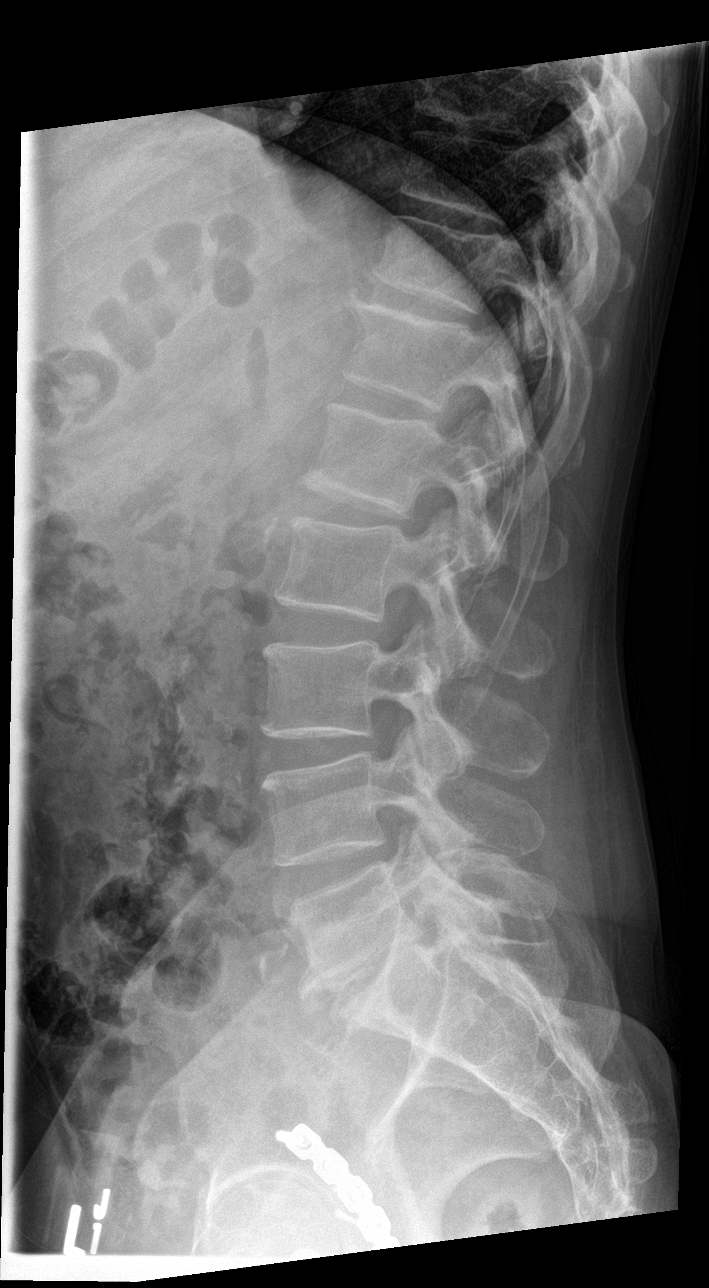

[l-spine spot]
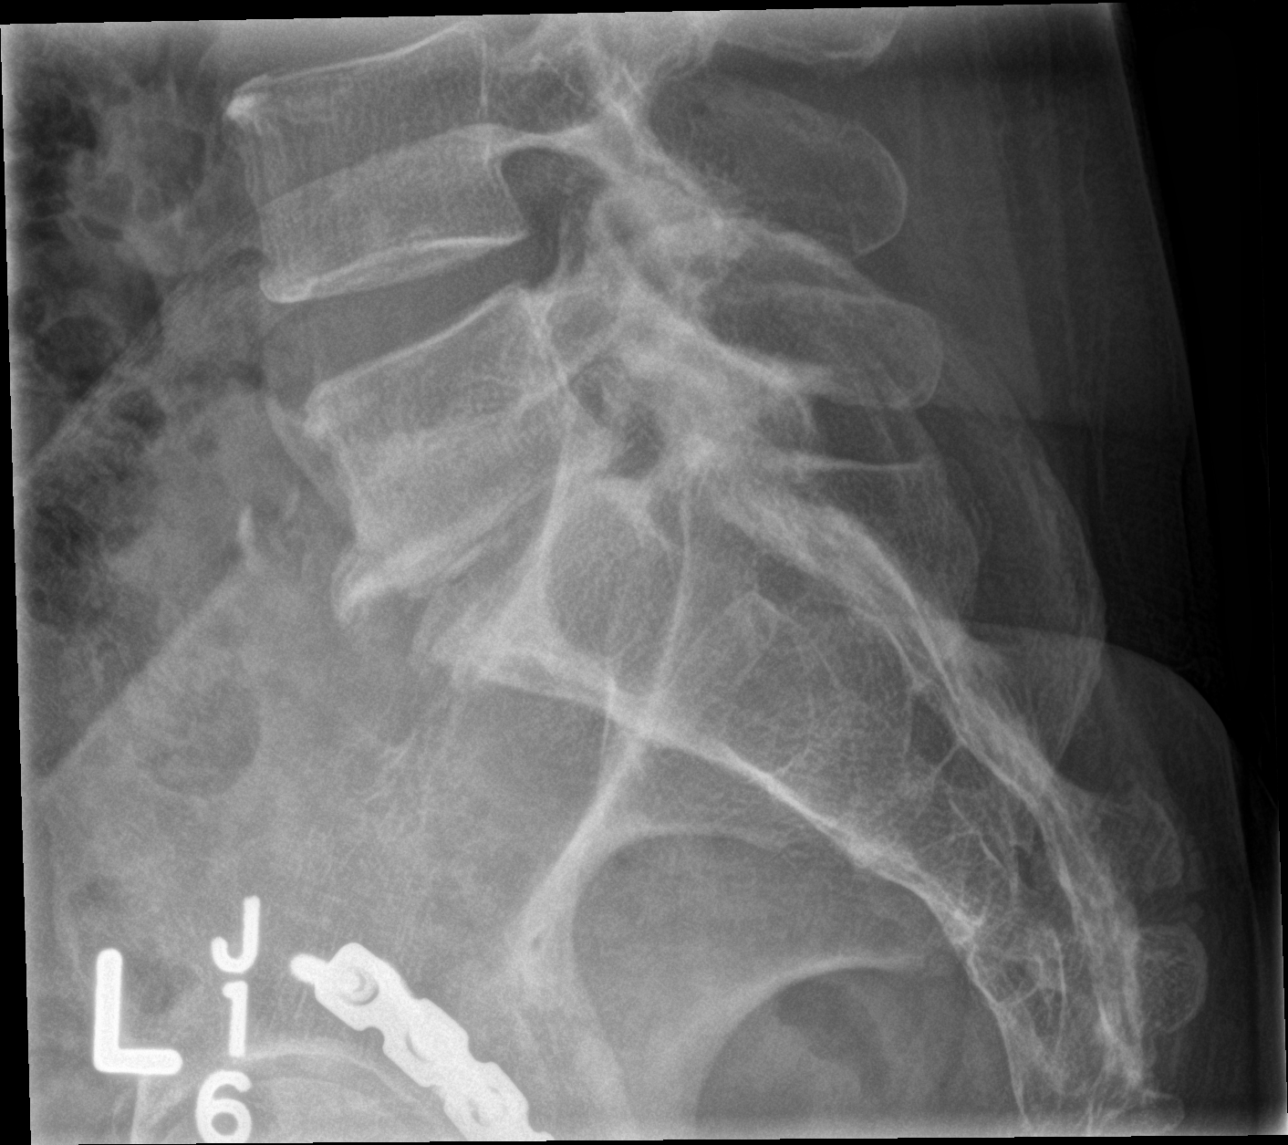

[5 of 5 positions shown; findings below may reference images not displayed]

FINDINGS: Frontal, lateral, spot lumbosacral lateral, and bilateral oblique
views were obtained. There is no fracture or spondylolisthesis.
There is stable levoscoliosis. There is no fracture or
spondylolisthesis. Marked disc space narrowing at L5-S1 is again
noted with anterior and posterior osteophytes at L5. Other disc
spaces appear intact. There is facet osteoarthritic change at L5-S1
bilaterally. There is postoperative change in the left acetabular
region and proximal left femur, stable.
IMPRESSION: Marked osteoarthritic change at L5-S1, stable. No progression of
arthropathy is seen compared to prior study. There is stable
levoscoliosis. No fracture or spondylolisthesis. Postoperative
change in the left acetabulum and proximal left femur regions again
noted.

## 2017-07-12 DIAGNOSIS — I10 Essential (primary) hypertension: Secondary | ICD-10-CM | POA: Diagnosis not present

## 2017-07-12 DIAGNOSIS — I2583 Coronary atherosclerosis due to lipid rich plaque: Secondary | ICD-10-CM | POA: Diagnosis not present

## 2017-07-12 DIAGNOSIS — M545 Low back pain: Secondary | ICD-10-CM | POA: Diagnosis not present

## 2017-07-12 DIAGNOSIS — J441 Chronic obstructive pulmonary disease with (acute) exacerbation: Secondary | ICD-10-CM | POA: Diagnosis not present

## 2017-10-15 DIAGNOSIS — J441 Chronic obstructive pulmonary disease with (acute) exacerbation: Secondary | ICD-10-CM | POA: Diagnosis not present

## 2017-10-15 DIAGNOSIS — F341 Dysthymic disorder: Secondary | ICD-10-CM | POA: Diagnosis not present

## 2017-10-15 DIAGNOSIS — M545 Low back pain: Secondary | ICD-10-CM | POA: Diagnosis not present

## 2017-10-15 DIAGNOSIS — I2583 Coronary atherosclerosis due to lipid rich plaque: Secondary | ICD-10-CM | POA: Diagnosis not present

## 2017-10-15 DIAGNOSIS — I10 Essential (primary) hypertension: Secondary | ICD-10-CM | POA: Diagnosis not present

## 2017-12-29 DIAGNOSIS — R1111 Vomiting without nausea: Secondary | ICD-10-CM | POA: Diagnosis not present

## 2017-12-29 DIAGNOSIS — G8929 Other chronic pain: Secondary | ICD-10-CM | POA: Diagnosis not present

## 2017-12-29 DIAGNOSIS — M545 Low back pain: Secondary | ICD-10-CM | POA: Diagnosis not present

## 2018-01-03 DIAGNOSIS — M545 Low back pain: Secondary | ICD-10-CM | POA: Diagnosis not present

## 2018-01-03 DIAGNOSIS — M62830 Muscle spasm of back: Secondary | ICD-10-CM | POA: Diagnosis not present

## 2018-01-03 DIAGNOSIS — I2510 Atherosclerotic heart disease of native coronary artery without angina pectoris: Secondary | ICD-10-CM | POA: Diagnosis not present

## 2018-01-03 DIAGNOSIS — F17200 Nicotine dependence, unspecified, uncomplicated: Secondary | ICD-10-CM | POA: Diagnosis not present

## 2018-01-03 DIAGNOSIS — E782 Mixed hyperlipidemia: Secondary | ICD-10-CM | POA: Diagnosis not present

## 2018-01-03 DIAGNOSIS — G894 Chronic pain syndrome: Secondary | ICD-10-CM | POA: Diagnosis not present

## 2018-01-03 DIAGNOSIS — K219 Gastro-esophageal reflux disease without esophagitis: Secondary | ICD-10-CM | POA: Diagnosis not present

## 2018-01-17 DIAGNOSIS — E782 Mixed hyperlipidemia: Secondary | ICD-10-CM | POA: Diagnosis not present

## 2018-02-12 DIAGNOSIS — R1111 Vomiting without nausea: Secondary | ICD-10-CM | POA: Diagnosis not present

## 2018-02-12 DIAGNOSIS — F17200 Nicotine dependence, unspecified, uncomplicated: Secondary | ICD-10-CM | POA: Diagnosis not present

## 2018-02-12 DIAGNOSIS — I2510 Atherosclerotic heart disease of native coronary artery without angina pectoris: Secondary | ICD-10-CM | POA: Diagnosis not present

## 2018-02-12 DIAGNOSIS — G894 Chronic pain syndrome: Secondary | ICD-10-CM | POA: Diagnosis not present

## 2018-02-12 DIAGNOSIS — I252 Old myocardial infarction: Secondary | ICD-10-CM | POA: Diagnosis not present

## 2018-02-12 DIAGNOSIS — G8929 Other chronic pain: Secondary | ICD-10-CM | POA: Diagnosis not present

## 2018-02-12 DIAGNOSIS — E782 Mixed hyperlipidemia: Secondary | ICD-10-CM | POA: Diagnosis not present

## 2018-02-12 DIAGNOSIS — D519 Vitamin B12 deficiency anemia, unspecified: Secondary | ICD-10-CM | POA: Diagnosis not present

## 2018-02-12 DIAGNOSIS — G2581 Restless legs syndrome: Secondary | ICD-10-CM | POA: Diagnosis not present

## 2018-02-12 DIAGNOSIS — R1110 Vomiting, unspecified: Secondary | ICD-10-CM | POA: Diagnosis not present

## 2018-02-12 DIAGNOSIS — Z125 Encounter for screening for malignant neoplasm of prostate: Secondary | ICD-10-CM | POA: Diagnosis not present

## 2018-02-12 DIAGNOSIS — K219 Gastro-esophageal reflux disease without esophagitis: Secondary | ICD-10-CM | POA: Diagnosis not present

## 2018-02-12 DIAGNOSIS — J449 Chronic obstructive pulmonary disease, unspecified: Secondary | ICD-10-CM | POA: Diagnosis not present

## 2018-02-12 DIAGNOSIS — M62830 Muscle spasm of back: Secondary | ICD-10-CM | POA: Diagnosis not present

## 2018-02-12 DIAGNOSIS — R112 Nausea with vomiting, unspecified: Secondary | ICD-10-CM | POA: Diagnosis not present

## 2018-02-12 DIAGNOSIS — M545 Low back pain: Secondary | ICD-10-CM | POA: Diagnosis not present

## 2018-02-12 DIAGNOSIS — M25552 Pain in left hip: Secondary | ICD-10-CM | POA: Diagnosis not present

## 2018-02-13 ENCOUNTER — Other Ambulatory Visit: Payer: Self-pay | Admitting: Internal Medicine

## 2018-02-13 DIAGNOSIS — R109 Unspecified abdominal pain: Secondary | ICD-10-CM

## 2018-02-13 DIAGNOSIS — R112 Nausea with vomiting, unspecified: Secondary | ICD-10-CM

## 2018-02-15 ENCOUNTER — Encounter: Payer: Self-pay | Admitting: Internal Medicine

## 2018-02-20 ENCOUNTER — Ambulatory Visit (HOSPITAL_COMMUNITY): Admission: RE | Admit: 2018-02-20 | Payer: Medicare Other | Source: Ambulatory Visit

## 2018-04-19 DIAGNOSIS — Z6825 Body mass index (BMI) 25.0-25.9, adult: Secondary | ICD-10-CM | POA: Diagnosis not present

## 2018-04-19 DIAGNOSIS — Z9181 History of falling: Secondary | ICD-10-CM | POA: Diagnosis not present

## 2018-04-19 DIAGNOSIS — R0781 Pleurodynia: Secondary | ICD-10-CM | POA: Diagnosis not present

## 2018-04-30 ENCOUNTER — Encounter: Payer: Self-pay | Admitting: Gastroenterology

## 2018-05-17 ENCOUNTER — Ambulatory Visit: Payer: Self-pay | Admitting: Gastroenterology

## 2018-05-20 ENCOUNTER — Telehealth: Payer: Self-pay | Admitting: Gastroenterology

## 2018-05-20 ENCOUNTER — Ambulatory Visit: Payer: Self-pay | Admitting: Gastroenterology

## 2018-05-20 ENCOUNTER — Encounter: Payer: Self-pay | Admitting: Gastroenterology

## 2018-05-20 NOTE — Telephone Encounter (Signed)
PATIENT WAS A NO SHOW AND LETTER SENT  °

## 2018-06-18 DIAGNOSIS — E782 Mixed hyperlipidemia: Secondary | ICD-10-CM | POA: Diagnosis not present

## 2018-06-25 DIAGNOSIS — F17228 Nicotine dependence, chewing tobacco, with other nicotine-induced disorders: Secondary | ICD-10-CM | POA: Diagnosis not present

## 2018-06-25 DIAGNOSIS — G2581 Restless legs syndrome: Secondary | ICD-10-CM | POA: Diagnosis not present

## 2018-06-25 DIAGNOSIS — J449 Chronic obstructive pulmonary disease, unspecified: Secondary | ICD-10-CM | POA: Diagnosis not present

## 2018-06-25 DIAGNOSIS — K219 Gastro-esophageal reflux disease without esophagitis: Secondary | ICD-10-CM | POA: Diagnosis not present

## 2018-06-25 DIAGNOSIS — E782 Mixed hyperlipidemia: Secondary | ICD-10-CM | POA: Diagnosis not present

## 2018-06-25 DIAGNOSIS — Z96642 Presence of left artificial hip joint: Secondary | ICD-10-CM | POA: Diagnosis not present

## 2018-06-25 DIAGNOSIS — I10 Essential (primary) hypertension: Secondary | ICD-10-CM | POA: Diagnosis not present

## 2018-06-25 DIAGNOSIS — I2583 Coronary atherosclerosis due to lipid rich plaque: Secondary | ICD-10-CM | POA: Diagnosis not present

## 2018-06-25 DIAGNOSIS — K089 Disorder of teeth and supporting structures, unspecified: Secondary | ICD-10-CM | POA: Diagnosis not present

## 2018-11-04 DIAGNOSIS — F17228 Nicotine dependence, chewing tobacco, with other nicotine-induced disorders: Secondary | ICD-10-CM | POA: Diagnosis not present

## 2018-11-04 DIAGNOSIS — M545 Low back pain: Secondary | ICD-10-CM | POA: Diagnosis not present

## 2018-11-04 DIAGNOSIS — E782 Mixed hyperlipidemia: Secondary | ICD-10-CM | POA: Diagnosis not present

## 2018-11-04 DIAGNOSIS — G2581 Restless legs syndrome: Secondary | ICD-10-CM | POA: Diagnosis not present

## 2018-11-04 DIAGNOSIS — I2583 Coronary atherosclerosis due to lipid rich plaque: Secondary | ICD-10-CM | POA: Diagnosis not present

## 2018-11-04 DIAGNOSIS — K219 Gastro-esophageal reflux disease without esophagitis: Secondary | ICD-10-CM | POA: Diagnosis not present

## 2018-11-04 DIAGNOSIS — J449 Chronic obstructive pulmonary disease, unspecified: Secondary | ICD-10-CM | POA: Diagnosis not present

## 2018-11-23 DIAGNOSIS — K0889 Other specified disorders of teeth and supporting structures: Secondary | ICD-10-CM | POA: Diagnosis not present

## 2018-11-28 DIAGNOSIS — K0889 Other specified disorders of teeth and supporting structures: Secondary | ICD-10-CM | POA: Diagnosis not present

## 2019-03-17 DIAGNOSIS — I2583 Coronary atherosclerosis due to lipid rich plaque: Secondary | ICD-10-CM | POA: Diagnosis not present

## 2019-03-17 DIAGNOSIS — K219 Gastro-esophageal reflux disease without esophagitis: Secondary | ICD-10-CM | POA: Diagnosis not present

## 2019-03-17 DIAGNOSIS — G2581 Restless legs syndrome: Secondary | ICD-10-CM | POA: Diagnosis not present

## 2019-03-17 DIAGNOSIS — F39 Unspecified mood [affective] disorder: Secondary | ICD-10-CM | POA: Diagnosis not present

## 2019-03-17 DIAGNOSIS — J449 Chronic obstructive pulmonary disease, unspecified: Secondary | ICD-10-CM | POA: Diagnosis not present

## 2019-03-17 DIAGNOSIS — F419 Anxiety disorder, unspecified: Secondary | ICD-10-CM | POA: Diagnosis not present

## 2019-03-17 DIAGNOSIS — M545 Low back pain: Secondary | ICD-10-CM | POA: Diagnosis not present

## 2019-03-17 DIAGNOSIS — F17200 Nicotine dependence, unspecified, uncomplicated: Secondary | ICD-10-CM | POA: Diagnosis not present

## 2019-07-18 DIAGNOSIS — E782 Mixed hyperlipidemia: Secondary | ICD-10-CM | POA: Diagnosis not present

## 2019-07-18 DIAGNOSIS — D519 Vitamin B12 deficiency anemia, unspecified: Secondary | ICD-10-CM | POA: Diagnosis not present

## 2019-07-18 DIAGNOSIS — I10 Essential (primary) hypertension: Secondary | ICD-10-CM | POA: Diagnosis not present

## 2019-07-18 DIAGNOSIS — Z1329 Encounter for screening for other suspected endocrine disorder: Secondary | ICD-10-CM | POA: Diagnosis not present

## 2019-07-21 DIAGNOSIS — M545 Low back pain: Secondary | ICD-10-CM | POA: Diagnosis not present

## 2019-07-21 DIAGNOSIS — G2581 Restless legs syndrome: Secondary | ICD-10-CM | POA: Diagnosis not present

## 2019-07-21 DIAGNOSIS — J449 Chronic obstructive pulmonary disease, unspecified: Secondary | ICD-10-CM | POA: Diagnosis not present

## 2019-07-21 DIAGNOSIS — I2583 Coronary atherosclerosis due to lipid rich plaque: Secondary | ICD-10-CM | POA: Diagnosis not present

## 2019-07-21 DIAGNOSIS — F17228 Nicotine dependence, chewing tobacco, with other nicotine-induced disorders: Secondary | ICD-10-CM | POA: Diagnosis not present

## 2019-07-21 DIAGNOSIS — K219 Gastro-esophageal reflux disease without esophagitis: Secondary | ICD-10-CM | POA: Diagnosis not present

## 2019-07-21 DIAGNOSIS — E782 Mixed hyperlipidemia: Secondary | ICD-10-CM | POA: Diagnosis not present

## 2019-07-21 DIAGNOSIS — Z96642 Presence of left artificial hip joint: Secondary | ICD-10-CM | POA: Diagnosis not present

## 2019-09-25 DIAGNOSIS — K047 Periapical abscess without sinus: Secondary | ICD-10-CM | POA: Diagnosis not present

## 2019-09-25 DIAGNOSIS — R6884 Jaw pain: Secondary | ICD-10-CM | POA: Diagnosis not present

## 2019-09-25 DIAGNOSIS — M272 Inflammatory conditions of jaws: Secondary | ICD-10-CM | POA: Diagnosis not present

## 2019-11-11 DIAGNOSIS — R0781 Pleurodynia: Secondary | ICD-10-CM | POA: Diagnosis not present

## 2019-11-11 DIAGNOSIS — F39 Unspecified mood [affective] disorder: Secondary | ICD-10-CM | POA: Diagnosis not present

## 2019-11-11 DIAGNOSIS — Z0000 Encounter for general adult medical examination without abnormal findings: Secondary | ICD-10-CM | POA: Diagnosis not present

## 2019-11-11 DIAGNOSIS — K0889 Other specified disorders of teeth and supporting structures: Secondary | ICD-10-CM | POA: Diagnosis not present

## 2019-11-11 DIAGNOSIS — E782 Mixed hyperlipidemia: Secondary | ICD-10-CM | POA: Diagnosis not present

## 2019-11-11 DIAGNOSIS — I2583 Coronary atherosclerosis due to lipid rich plaque: Secondary | ICD-10-CM | POA: Diagnosis not present

## 2019-11-11 DIAGNOSIS — F419 Anxiety disorder, unspecified: Secondary | ICD-10-CM | POA: Diagnosis not present

## 2019-11-11 DIAGNOSIS — R7301 Impaired fasting glucose: Secondary | ICD-10-CM | POA: Diagnosis not present

## 2019-11-11 DIAGNOSIS — Z96642 Presence of left artificial hip joint: Secondary | ICD-10-CM | POA: Diagnosis not present

## 2019-11-11 DIAGNOSIS — F17228 Nicotine dependence, chewing tobacco, with other nicotine-induced disorders: Secondary | ICD-10-CM | POA: Diagnosis not present

## 2019-11-11 DIAGNOSIS — K089 Disorder of teeth and supporting structures, unspecified: Secondary | ICD-10-CM | POA: Diagnosis not present

## 2019-11-13 DIAGNOSIS — D519 Vitamin B12 deficiency anemia, unspecified: Secondary | ICD-10-CM | POA: Diagnosis not present

## 2019-11-13 DIAGNOSIS — F17219 Nicotine dependence, cigarettes, with unspecified nicotine-induced disorders: Secondary | ICD-10-CM | POA: Diagnosis not present

## 2019-11-13 DIAGNOSIS — F419 Anxiety disorder, unspecified: Secondary | ICD-10-CM | POA: Diagnosis not present

## 2019-11-13 DIAGNOSIS — G2581 Restless legs syndrome: Secondary | ICD-10-CM | POA: Diagnosis not present

## 2019-11-13 DIAGNOSIS — E782 Mixed hyperlipidemia: Secondary | ICD-10-CM | POA: Diagnosis not present

## 2019-11-13 DIAGNOSIS — Z0000 Encounter for general adult medical examination without abnormal findings: Secondary | ICD-10-CM | POA: Diagnosis not present

## 2019-11-13 DIAGNOSIS — F17200 Nicotine dependence, unspecified, uncomplicated: Secondary | ICD-10-CM | POA: Diagnosis not present

## 2019-11-13 DIAGNOSIS — F39 Unspecified mood [affective] disorder: Secondary | ICD-10-CM | POA: Diagnosis not present

## 2019-11-13 DIAGNOSIS — F17228 Nicotine dependence, chewing tobacco, with other nicotine-induced disorders: Secondary | ICD-10-CM | POA: Diagnosis not present

## 2019-11-20 DIAGNOSIS — I2510 Atherosclerotic heart disease of native coronary artery without angina pectoris: Secondary | ICD-10-CM | POA: Diagnosis not present

## 2019-11-20 DIAGNOSIS — G894 Chronic pain syndrome: Secondary | ICD-10-CM | POA: Diagnosis not present

## 2019-11-20 DIAGNOSIS — N3090 Cystitis, unspecified without hematuria: Secondary | ICD-10-CM | POA: Diagnosis not present

## 2019-11-20 DIAGNOSIS — E782 Mixed hyperlipidemia: Secondary | ICD-10-CM | POA: Diagnosis not present

## 2019-11-20 DIAGNOSIS — K219 Gastro-esophageal reflux disease without esophagitis: Secondary | ICD-10-CM | POA: Diagnosis not present

## 2019-11-20 DIAGNOSIS — F17200 Nicotine dependence, unspecified, uncomplicated: Secondary | ICD-10-CM | POA: Diagnosis not present

## 2020-06-01 ENCOUNTER — Other Ambulatory Visit (HOSPITAL_COMMUNITY): Payer: Self-pay | Admitting: Internal Medicine

## 2020-06-01 DIAGNOSIS — J449 Chronic obstructive pulmonary disease, unspecified: Secondary | ICD-10-CM

## 2020-08-12 ENCOUNTER — Ambulatory Visit: Payer: Medicare HMO | Admitting: Cardiology

## 2020-08-12 NOTE — Progress Notes (Deleted)
Clinical Summary Dylan Dalton is a 59 y.o.male  1. CAD - from pcp notes history of MI 3 years ago - in care everywhere able to locate admission records from 03/2017 at [facility] - admitted with NSTEMI - ASA allergy, received desensitization - 03/2017 cath with mild nonobstructive disease as reported below.  - 8/20218 echo LVEF 60-65%, mild LVH   2.  - admitted to [facility] 07/23/20 with cardiac arrest. From notes thought to be primary respiratory arrest. Resucitated and intubated. - CT PE negative for PE, did show signs of pneumonia - extubated after 4 days, managed for EtOH withdrawal as well  Past Medical History:  Diagnosis Date  . Bronchitis   . Chronic back pain      Allergies  Allergen Reactions  . Aspirin     Shortness of breath     Current Outpatient Medications  Medication Sig Dispense Refill  . cyclobenzaprine (FLEXERIL) 5 MG tablet Take 1 tablet (5 mg total) by mouth 2 (two) times daily as needed for muscle spasms. 15 tablet 0  . traMADol (ULTRAM) 50 MG tablet Take 1 tablet (50 mg total) by mouth every 6 (six) hours as needed. 15 tablet 0   No current facility-administered medications for this visit.     Past Surgical History:  Procedure Laterality Date  . HIP SURGERY       Allergies  Allergen Reactions  . Aspirin     Shortness of breath      Family History  Problem Relation Age of Onset  . Diabetes Mother   . Hyperlipidemia Mother   . Hypertension Mother   . Arthritis Other   . Asthma Other      Social History Dylan Dalton reports that he has been smoking cigarettes. He has a 15.00 pack-year smoking history. He has never used smokeless tobacco. Dylan Dalton reports current alcohol use of about 3.0 standard drinks of alcohol per week.   Review of Systems CONSTITUTIONAL: No weight loss, fever, chills, weakness or fatigue.  HEENT: Eyes: No visual loss, blurred vision, double vision or yellow sclerae.No hearing loss, sneezing, congestion, runny nose  or sore throat.  SKIN: No rash or itching.  CARDIOVASCULAR:  RESPIRATORY: No shortness of breath, cough or sputum.  GASTROINTESTINAL: No anorexia, nausea, vomiting or diarrhea. No abdominal pain or blood.  GENITOURINARY: No burning on urination, no polyuria NEUROLOGICAL: No headache, dizziness, syncope, paralysis, ataxia, numbness or tingling in the extremities. No change in bowel or bladder control.  MUSCULOSKELETAL: No muscle, back pain, joint pain or stiffness.  LYMPHATICS: No enlarged nodes. No history of splenectomy.  PSYCHIATRIC: No history of depression or anxiety.  ENDOCRINOLOGIC: No reports of sweating, cold or heat intolerance. No polyuria or polydipsia.  Marland Kitchen   Physical Examination There were no vitals filed for this visit. There were no vitals filed for this visit.  Gen: resting comfortably, no acute distress HEENT: no scleral icterus, pupils equal round and reactive, no palptable cervical adenopathy,  CV Resp: Clear to auscultation bilaterally GI: abdomen is soft, non-tender, non-distended, normal bowel sounds, no hepatosplenomegaly MSK: extremities are warm, no edema.  Skin: warm, no rash Neuro:  no focal deficits Psych: appropriate affect   Diagnostic Studies  03/2017 cath [facility] Result Date: 03/09/2017 Cardiac Catheterization Laboratory [facility], [place] Tel: [phone] Fax: [phone] FINAL CARDIAC CATHETERIZATION REPORT Date of Procedure: 03/09/17 ___________________________________________________________________________ _ Findings: 1. Non-obstructive coronary artery disease Recommendations: 1. Aggressive secondary prevention. 2. Dual antiplatelet therapy with  aspirin and clopidogrel for at least 12 months, ideally longer. 3. Establish care with primary cardiologist Complications:    Left Main: The left main coronary artery (LMCA) is a large-caliber vessel  that originates from the left coronary sinus. It bifurcates into the left   anterior descending (LAD) and left circumflex (LCx) arteries. There is no  angiographic evidence of significant disease in the LMCA.   LAD: The LAD is a large-caliber tapering vessel that gives off two  diagonal (D) branches before it wraps around the apex. D1 and D2 are  medium-caliber vessels that supply a moderate territory. There is mild  diffuse disease of the LAD. There is a bridge segment near the mid-distal  transition.   Left Circumflex: The LCx is a large-caliber vessel that gives off four  obtuse marginal (OM) branches and then continues as a small vessel in the  AV groove. OM1 is a tiny-caliber vessel. OM2 is a small-caliber vessel.  OM3 and OM4 are medium-caliber vessels that supply a large territory.  There mild diffuse disease of the LCx, including up to 30% lesion of the  mid-LCx.   Right Coronary: The right coronary artery (RCA) is a large-caliber vessel  originating from the right coronary sinus. It bifurcates distally into the  posterior descending artery (PDA) and a posterolateral (PL) Koron Godeaux  consistent with a right dominant system. There is mild diffuse disease in  the RCA.    03/2017 Echo [facility] This result has an attachment that is not available.   Left ventricular hypertrophy - mild   Normal left ventricular systolic function, ejection fraction 60 to 65%   Normal right ventricular systolic function   No significant valvular abnormalities   07/2020 CT PE [facility] 1. No pulmonary embolus.  2. Right lower lobe consolidation with multifocal opacity in the  right middle and upper lobes, concerning for aspiration or  pneumonia.  3. Abnormal appearance of the gallbladder, incompletely visualized.  Consider correlation with right upper quadrant ultrasound to  evaluate for cholecystitis.    Assessment and Plan        Antoine Poche, M.D., F.A.C.C.
